# Patient Record
Sex: Male | Born: 1990 | Race: Asian | Hispanic: No | Marital: Single | Smoking: Former smoker
Health system: Southern US, Community
[De-identification: ages and names within clinical notes are randomized; demographics above are authoritative.]

## PROBLEM LIST (undated history)

## (undated) DIAGNOSIS — I2699 Other pulmonary embolism without acute cor pulmonale: Secondary | ICD-10-CM

## (undated) DIAGNOSIS — G43909 Migraine, unspecified, not intractable, without status migrainosus: Secondary | ICD-10-CM

---

## 2016-08-19 ENCOUNTER — Emergency Department (HOSPITAL_BASED_OUTPATIENT_CLINIC_OR_DEPARTMENT_OTHER)
Admission: EM | Admit: 2016-08-19 | Discharge: 2016-08-19 | Disposition: A | Discharge status: Home / Self Care | Payer: BLUE CROSS/BLUE SHIELD | Attending: Emergency Medicine | Admitting: Emergency Medicine

## 2016-08-19 ENCOUNTER — Emergency Department (HOSPITAL_BASED_OUTPATIENT_CLINIC_OR_DEPARTMENT_OTHER): Payer: BLUE CROSS/BLUE SHIELD

## 2016-08-19 ENCOUNTER — Encounter (HOSPITAL_BASED_OUTPATIENT_CLINIC_OR_DEPARTMENT_OTHER): Payer: Self-pay | Admitting: *Deleted

## 2016-08-19 DIAGNOSIS — Y929 Unspecified place or not applicable: Secondary | ICD-10-CM | POA: Insufficient documentation

## 2016-08-19 DIAGNOSIS — S0990XA Unspecified injury of head, initial encounter: Secondary | ICD-10-CM | POA: Diagnosis present

## 2016-08-19 DIAGNOSIS — Y999 Unspecified external cause status: Secondary | ICD-10-CM | POA: Insufficient documentation

## 2016-08-19 DIAGNOSIS — S060X0A Concussion without loss of consciousness, initial encounter: Secondary | ICD-10-CM | POA: Diagnosis not present

## 2016-08-19 DIAGNOSIS — Y9389 Activity, other specified: Secondary | ICD-10-CM | POA: Insufficient documentation

## 2016-08-19 MED ORDER — MECLIZINE HCL 25 MG PO TABS: 25.0000 mg | ORAL_TABLET | Freq: Three times a day (TID) | ORAL | 0 refills | Status: DC | PRN
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
## 2021-10-24 ENCOUNTER — Emergency Department (HOSPITAL_BASED_OUTPATIENT_CLINIC_OR_DEPARTMENT_OTHER): Payer: BC Managed Care – PPO

## 2021-10-24 ENCOUNTER — Emergency Department (HOSPITAL_BASED_OUTPATIENT_CLINIC_OR_DEPARTMENT_OTHER)
Admission: EM | Admit: 2021-10-24 | Discharge: 2021-10-24 | Disposition: A | Discharge status: Home / Self Care | Payer: BC Managed Care – PPO | Attending: Emergency Medicine | Admitting: Emergency Medicine

## 2021-10-24 ENCOUNTER — Encounter (HOSPITAL_BASED_OUTPATIENT_CLINIC_OR_DEPARTMENT_OTHER): Payer: Self-pay

## 2021-10-24 ENCOUNTER — Other Ambulatory Visit: Payer: Self-pay

## 2021-10-24 DIAGNOSIS — S59901A Unspecified injury of right elbow, initial encounter: Secondary | ICD-10-CM | POA: Diagnosis not present

## 2021-10-24 DIAGNOSIS — S29001A Unspecified injury of muscle and tendon of front wall of thorax, initial encounter: Secondary | ICD-10-CM | POA: Insufficient documentation

## 2021-10-24 DIAGNOSIS — S99912A Unspecified injury of left ankle, initial encounter: Secondary | ICD-10-CM | POA: Insufficient documentation

## 2021-10-24 DIAGNOSIS — S3991XA Unspecified injury of abdomen, initial encounter: Secondary | ICD-10-CM | POA: Insufficient documentation

## 2021-10-24 DIAGNOSIS — Y92410 Unspecified street and highway as the place of occurrence of the external cause: Secondary | ICD-10-CM | POA: Insufficient documentation

## 2021-10-24 LAB — COMPREHENSIVE METABOLIC PANEL
ALT: 57 U/L — ABNORMAL HIGH (ref 0–44)
AST: 37 U/L (ref 15–41)
Albumin: 4.6 g/dL (ref 3.5–5.0)
Alkaline Phosphatase: 55 U/L (ref 38–126)
Anion gap: 12 (ref 5–15)
BUN: 12 mg/dL (ref 6–20)
CO2: 23 mmol/L (ref 22–32)
Calcium: 8.8 mg/dL — ABNORMAL LOW (ref 8.9–10.3)
Chloride: 101 mmol/L (ref 98–111)
Creatinine, Ser: 0.97 mg/dL (ref 0.61–1.24)
GFR, Estimated: >60 mL/min (ref >60)
Glucose, Bld: 90 mg/dL (ref 70–99)
Potassium: 3.6 mmol/L (ref 3.5–5.1)
Sodium: 136 mmol/L (ref 135–145)
Total Bilirubin: 1.2 mg/dL (ref 0.3–1.2)
Total Protein: 7.5 g/dL (ref 6.5–8.1)

## 2021-10-24 LAB — CBC WITH DIFFERENTIAL/PLATELET
Abs Immature Granulocytes: 0.06 10*3/uL (ref 0.00–0.07)
Basophils Absolute: 0.1 10*3/uL (ref 0.0–0.1)
Basophils Relative: 0 %
Eosinophils Absolute: 0.1 10*3/uL (ref 0.0–0.5)
Eosinophils Relative: 1 %
HCT: 46.3 % (ref 39.0–52.0)
Hemoglobin: 15.8 g/dL (ref 13.0–17.0)
Immature Granulocytes: 1 %
Lymphocytes Relative: 19 %
Lymphs Abs: 2.2 10*3/uL (ref 0.7–4.0)
MCH: 29.3 pg (ref 26.0–34.0)
MCHC: 34.1 g/dL (ref 30.0–36.0)
MCV: 85.7 fL (ref 80.0–100.0)
Monocytes Absolute: 0.8 10*3/uL (ref 0.1–1.0)
Monocytes Relative: 7 %
Neutro Abs: 8.5 10*3/uL — ABNORMAL HIGH (ref 1.7–7.7)
Neutrophils Relative %: 72 %
Platelets: 223 10*3/uL (ref 150–400)
RBC: 5.4 MIL/uL (ref 4.22–5.81)
RDW: 12.3 % (ref 11.5–15.5)
WBC: 11.7 10*3/uL — ABNORMAL HIGH (ref 4.0–10.5)
nRBC: 0 % (ref 0.0–0.2)

## 2021-10-24 LAB — LIPASE, BLOOD: Lipase: 31 U/L (ref 11–51)

## 2021-10-24 MED ORDER — IOHEXOL 300 MG/ML  SOLN
100.0000 mL | Freq: Once | INTRAMUSCULAR | Status: AC | PRN
  Administered 2021-10-24: 100 mL via INTRAVENOUS

## 2021-10-24 MED ORDER — MORPHINE SULFATE (PF) 4 MG/ML IV SOLN
4.0000 mg | Freq: Once | INTRAVENOUS | Status: AC
  Administered 2021-10-24: 4 mg via INTRAVENOUS
  Filled 2021-10-24: qty 1

## 2021-10-24 MED ORDER — ONDANSETRON HCL 4 MG/2ML IJ SOLN
4.0000 mg | Freq: Once | INTRAMUSCULAR | Status: AC
  Administered 2021-10-24: 4 mg via INTRAVENOUS
  Filled 2021-10-24: qty 2
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
## 2021-10-24 NOTE — ED Notes (Signed)
Patient transported to CT 

## 2021-10-24 NOTE — Discharge Instructions (Addendum)
Call your primary care doctor or specialist as discussed in the next 2-3 days.   Return immediately back to the ER if:  Your symptoms worsen within the next 12-24 hours. You develop new symptoms such as new fevers, persistent vomiting, new pain, shortness of breath, or new weakness or numbness, or if you have any other concerns.  
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
## 2023-05-07 ENCOUNTER — Other Ambulatory Visit: Payer: Self-pay

## 2023-05-07 ENCOUNTER — Emergency Department (HOSPITAL_BASED_OUTPATIENT_CLINIC_OR_DEPARTMENT_OTHER): Payer: BC Managed Care – PPO

## 2023-05-07 ENCOUNTER — Emergency Department (HOSPITAL_BASED_OUTPATIENT_CLINIC_OR_DEPARTMENT_OTHER)
Admission: EM | Admit: 2023-05-07 | Discharge: 2023-05-07 | Discharge status: Against Medical Advice | Payer: BC Managed Care – PPO | Attending: Emergency Medicine | Admitting: Emergency Medicine

## 2023-05-07 ENCOUNTER — Encounter (HOSPITAL_BASED_OUTPATIENT_CLINIC_OR_DEPARTMENT_OTHER): Payer: Self-pay | Admitting: Emergency Medicine

## 2023-05-07 DIAGNOSIS — R519 Headache, unspecified: Secondary | ICD-10-CM | POA: Diagnosis present

## 2023-05-07 DIAGNOSIS — I7774 Dissection of vertebral artery: Secondary | ICD-10-CM | POA: Diagnosis present

## 2023-05-07 DIAGNOSIS — Z5329 Procedure and treatment not carried out because of patient's decision for other reasons: Secondary | ICD-10-CM | POA: Diagnosis not present

## 2023-05-07 HISTORY — DX: Other pulmonary embolism without acute cor pulmonale: I26.99

## 2023-05-07 HISTORY — DX: Migraine, unspecified, not intractable, without status migrainosus: G43.909

## 2023-05-07 LAB — CBC WITH DIFFERENTIAL/PLATELET
Abs Immature Granulocytes: 0.03 10*3/uL (ref 0.00–0.07)
Basophils Absolute: 0 10*3/uL (ref 0.0–0.1)
Basophils Relative: 1 %
Eosinophils Absolute: 0.1 10*3/uL (ref 0.0–0.5)
Eosinophils Relative: 2 %
HCT: 47.8 % (ref 39.0–52.0)
Hemoglobin: 16.1 g/dL (ref 13.0–17.0)
Immature Granulocytes: 0 %
Lymphocytes Relative: 28 %
Lymphs Abs: 2.1 10*3/uL (ref 0.7–4.0)
MCH: 28.6 pg (ref 26.0–34.0)
MCHC: 33.7 g/dL (ref 30.0–36.0)
MCV: 84.9 fL (ref 80.0–100.0)
Monocytes Absolute: 0.5 10*3/uL (ref 0.1–1.0)
Monocytes Relative: 7 %
Neutro Abs: 4.6 10*3/uL (ref 1.7–7.7)
Neutrophils Relative %: 62 %
Platelets: 325 10*3/uL (ref 150–400)
RBC: 5.63 MIL/uL (ref 4.22–5.81)
RDW: 11.7 % (ref 11.5–15.5)
WBC: 7.4 10*3/uL (ref 4.0–10.5)
nRBC: 0 % (ref 0.0–0.2)

## 2023-05-07 LAB — BASIC METABOLIC PANEL WITH GFR
Anion gap: 11 (ref 5–15)
BUN: 14 mg/dL (ref 6–20)
CO2: 26 mmol/L (ref 22–32)
Calcium: 9.4 mg/dL (ref 8.9–10.3)
Chloride: 101 mmol/L (ref 98–111)
Creatinine, Ser: 1.19 mg/dL (ref 0.61–1.24)
GFR, Estimated: >60 mL/min (ref >60)
Glucose, Bld: 96 mg/dL (ref 70–99)
Potassium: 3.3 mmol/L — ABNORMAL LOW (ref 3.5–5.1)
Sodium: 138 mmol/L (ref 135–145)

## 2023-05-07 MED ORDER — IOHEXOL 350 MG/ML SOLN
75.0000 mL | Freq: Once | INTRAVENOUS | Status: AC | PRN
  Administered 2023-05-07: 75 mL via INTRAVENOUS

## 2023-05-07 MED ORDER — PROCHLORPERAZINE EDISYLATE 10 MG/2ML IJ SOLN
10.0000 mg | Freq: Once | INTRAMUSCULAR | Status: AC
  Administered 2023-05-07: 10 mg via INTRAVENOUS
  Filled 2023-05-07: qty 2

## 2023-05-07 MED ORDER — DIPHENHYDRAMINE HCL 50 MG/ML IJ SOLN
12.5000 mg | Freq: Once | INTRAMUSCULAR | Status: AC
Start: 2023-05-07 — End: 2023-05-07
  Administered 2023-05-07: 12.5 mg via INTRAVENOUS
  Filled 2023-05-07: qty 1

## 2023-05-07 MED ORDER — APIXABAN 2.5 MG PO TABS: 5.0000 mg | ORAL_TABLET | Freq: Two times a day (BID) | ORAL | Status: DC

## 2023-05-07 MED ORDER — SODIUM CHLORIDE 0.9 % IV BOLUS
500.0000 mL | Freq: Once | INTRAVENOUS | Status: AC
  Administered 2023-05-07: 500 mL via INTRAVENOUS

## 2023-05-07 NOTE — Discharge Instructions (Signed)
You were seen in the emergency department for worsening headaches and neck pain.  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  Neurology is recommending admission so you can get an MRI of your brain along with neurology consult.  You elected to sign out AGAINST MEDICAL ADVICE.  I do recommend that you be seen at [facility] for further evaluation.

## 2023-05-07 NOTE — ED Notes (Signed)
Dr. Charm Barges has had conversations with the Pt. About need for an MRI of his brain.    The Pt. Has asked to go home and will sign out AMA.

## 2023-05-07 NOTE — ED Notes (Signed)
Patient denies pain and is resting comfortably.  

## 2023-05-07 NOTE — ED Notes (Signed)
Called [facility] to cancel bed placement , patient left AMA. @ 23:24

## 2023-05-07 NOTE — ED Notes (Signed)
This nurse was walking to the room. Patient was outside getting ready to leave. I informed the patient that he needed to sign the AMA form. Form was signed electronically. This nurse advised the patient that he needed to call 911 if his symptoms worsened. Patient advised that he understood. This nurse also informed him to go to [facility] tomorrow for an MRI early in the morning. Patient still decided to leave. Patient is ambulatory. No respiratory distress.

## 2023-05-07 NOTE — ED Notes (Signed)
Pt. Is feeling better with no headache

## 2023-05-07 NOTE — ED Notes (Signed)
Walked into room as the patient called out for assistance. Patient was inquiring about the results of his CT scan. Patient declined vital signs.

## 2023-05-07 NOTE — ED Notes (Signed)
Assumed care of patient.

## 2023-05-07 NOTE — ED Triage Notes (Signed)
Pt to ER states chronic migraines.  States and increase in frequency of headache with aura, last being yesterday.  Wanted to have things checked out since they have increases.

## 2023-05-07 NOTE — ED Notes (Signed)
Pt. Reports to RN he had a pain in the back of his neck a week or so ago.  Pt. Reports since the pain in the back of his neck he has had more frequent headaches.  Pt. In no distress at this time with no noted disabilities.  Pt. Has had no visual disturbance with only and Ora to his headache of black spots that show him he is getting a headache.

## 2023-05-07 NOTE — ED Notes (Signed)
Patient transported to CT 

## 2023-05-07 NOTE — ED Provider Notes (Signed)
[place] EMERGENCY DEPARTMENT AT [facility] Provider Note   CSN: [id number] Arrival date & time: 05/07/23  1503     History  Chief Complaint  Patient presents with   Migraine    [place] Parks is a 32 y.o. male.  He has a history of migraines and PE.  For the last week he has had increased frequency of headaches almost daily sometimes incapacitating.  Associated with the visual aura of wavy lines.  He typically was getting headaches maybe once or twice a month.  About a week ago he also said when he was getting out of the car he had an acute onset of pain in the back of his neck that felt electrical.  That pain has resolved but that is when the headache frequency started.  He has not seen a neurologist before.  He said when he takes ibuprofen it seems to help the headache but then it does not last long.  He is on Eliquis for PE.  The history is provided by the patient.  Migraine This is a recurrent problem. The problem occurs daily. The problem has not changed since onset.Pertinent negatives include no chest pain, no abdominal pain and no shortness of breath. Exacerbated by: light. Nothing relieves the symptoms. He has tried rest for the symptoms. The treatment provided mild relief.       Home Medications Prior to Admission medications   Medication Sig Start Date End Date Taking? Authorizing Provider  meclizine (ANTIVERT) 25 MG tablet Take 1 tablet (25 mg total) by mouth 3 (three) times daily as needed for dizziness. 08/19/16   Shaune Pollack, MD      Allergies    Patient has no known allergies.    Review of Systems   Review of Systems  Constitutional:  Negative for fever.  Eyes:  Positive for visual disturbance.  Respiratory:  Negative for shortness of breath.   Cardiovascular:  Negative for chest pain.  Gastrointestinal:  Negative for abdominal pain.  Neurological:  Negative for weakness and numbness.    Physical Exam Updated Vital Signs BP 128/82 (BP  Location: Left Arm)   Pulse 89   Temp 98.1 F (36.7 C)   Resp 18   Ht 5\' 9"  (1.753 m)   Wt 73.5 kg   SpO2 99%   BMI 23.92 kg/m  Physical Exam Vitals and nursing note reviewed.  Constitutional:      General: He is not in acute distress.    Appearance: Normal appearance. He is well-developed.  HENT:     Head: Normocephalic and atraumatic.  Eyes:     Conjunctiva/sclera: Conjunctivae normal.  Cardiovascular:     Rate and Rhythm: Normal rate and regular rhythm.     Heart sounds: No murmur heard. Pulmonary:     Effort: Pulmonary effort is normal. No respiratory distress.     Breath sounds: Normal breath sounds.  Abdominal:     Palpations: Abdomen is soft.     Tenderness: There is no abdominal tenderness.  Musculoskeletal:        General: No swelling.     Cervical back: Neck supple.  Skin:    General: Skin is warm and dry.     Capillary Refill: Capillary refill takes less than 2 seconds.  Neurological:     General: No focal deficit present.     Mental Status: He is alert and oriented to person, place, and time.     Cranial Nerves: No cranial nerve deficit.  Sensory: No sensory deficit.     Motor: No weakness.     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 Multiplanar CT image reconstructions and MIPs were  obtained to evaluate the vascular anatomy. Carotid stenosis measurements (when applicable) are obtained utilizing NASCET criteria, using the distal internal carotid diameter as the denominator. RADIATION DOSE REDUCTION: This exam was performed according to the departmental dose-optimization program which includes automated exposure control, adjustment of the mA and/or kV according to patient size and/or use of iterative reconstruction technique. CONTRAST:  75mL OMNIPAQUE IOHEXOL 350 MG/ML SOLN COMPARISON:  None Available. FINDINGS: CT HEAD FINDINGS Brain: No evidence of acute infarction, hemorrhage, hydrocephalus, extra-axial collection or mass lesion/mass effect. Vascular: See below. Skull: No acute fracture. Sinuses/Orbits: Clear sinuses.  No acute orbital findings. Other: No mastoid effusions. Review of the MIP images confirms the above findings CTA NECK FINDINGS Aortic arch: Great vessel origins are patent without significant stenosis. Right carotid system: No evidence of dissection, stenosis (50% or greater), or occlusion. Left carotid system: No evidence of dissection, stenosis (50% or greater), or occlusion. Vertebral arteries: Focal luminal regularity and eccentric filling defect within the distal right vertebral artery (series 11, image 183; series 13, image 82), which is highly suspicious for dissection with thrombus given the history and absence of atherosclerosis elsewhere. Moderate resulting stenosis. Otherwise, no significant stenosis. Right dominant. Skeleton: No acute fracture. Other neck: No acute abnormality on limited assessment. Upper chest: Visualized lung apices are clear. Review of the MIP images confirms the above findings CTA HEAD FINDINGS Anterior circulation: Bilateral intracranial ICAs, MCAs, and ACAs are patent without proximal hemodynamically significant stenosis. Posterior circulation: Bilateral intradural vertebral arteries, basilar artery and bilateral posterior cerebral arteries are  patent without proximal hemodynamically significant stenosis. Right fetal type PCA with small vertebrobasilar system, anatomic variant. Venous sinuses: As permitted by contrast timing, patent. 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 Electronically Signed   By: Feliberto Harts M.D.   On: 05/07/2023 21:38    Procedures Procedures    Medications Ordered in ED Medications  apixaban (ELIQUIS) tablet 5 mg (has no administration in time range)  prochlorperazine (COMPAZINE) injection 10 mg (10 mg Intravenous Given 05/07/23 1935)  diphenhydrAMINE (BENADRYL) injection 12.5 mg (12.5 mg Intravenous Given 05/07/23 1942)  sodium chloride 0.9 % bolus 500 mL (0 mLs Intravenous Stopped 05/07/23 2134)  iohexol (OMNIPAQUE) 350 MG/ML injection 75 mL (75 mLs Intravenous Contrast Given 05/07/23 2019)    ED Course/ Medical Decision Making/ A&P Clinical Course as of 05/07/23 2319  Wed May 07, 2023  2255 Discussed with neurologist Dr. Otelia Limes.  He felt the patient should be admitted to [facility] for MRI brain and formal neurology consultation.  I reviewed this with  patient and he is agreeable to plan. [MB]  2306 Discussed with Dr. Margo Aye [facility] hospitalist who will put the patient in for an admission at [facility].  I have also ordered his home Eliquis. [MB]  2318 Patient asked me to come talk to him more about his admission.  He says he will not be able to sleep here and he really just wants to go to his own bed.  I did say that it was safer for him to wait in the hospital for a bed to become available and get the MRI and neurology consult.  He understands this but he said he would rather sign out AGAINST MEDICAL ADVICE and try to go to [facility] tomorrow on his own to get the MRI and see neurology.  He understands that he could have worsening of his condition overnight including neurologic  disability or even death. [MB]    Clinical Course User Index [MB] Terrilee Files, MD                                 Medical Decision Making Amount and/or Complexity of Data Reviewed Labs: ordered. Radiology: ordered.  Risk Prescription drug management.   This patient complains of worsening migraines; this involves an extensive number of treatment Options and is a complaint that carries with it a high risk of complications and morbidity. The differential includes migraine headache, bleed, stroke, dissection  I ordered, reviewed and interpreted labs, which included CBC normal chemistries normal other than mildly low potassium I ordered medication Compazine and Benadryl fluids and reviewed PMP when indicated. I ordered imaging studies which included CT angio head and neck and I independently    visualized and interpreted imaging which showed possible right V2 dissection Previous records obtained and reviewed in epic, patient treated for PE back this January I consulted neurology Dr. Otelia Limes and [facility] hospitalist Dr. Margo Aye and discussed lab and imaging findings and discussed disposition.  Cardiac monitoring reviewed, signed this rhythm Social determinants considered, no significant barriers Critical Interventions: None  After the interventions stated above, I reevaluated the patient and found awake alert neuro intact with stable vitals Admission and further testing considered, I am recommending admission to the hospital and have talked to the hospitalist regarding same.  Ultimately patient is signing out AGAINST MEDICAL ADVICE.         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

## 2023-05-08 ENCOUNTER — Encounter (HOSPITAL_COMMUNITY): Payer: Self-pay

## 2023-05-08 ENCOUNTER — Emergency Department (HOSPITAL_COMMUNITY): Payer: BC Managed Care – PPO

## 2023-05-08 ENCOUNTER — Other Ambulatory Visit: Payer: Self-pay

## 2023-05-08 ENCOUNTER — Observation Stay (HOSPITAL_COMMUNITY)
Admission: EM | Admit: 2023-05-08 | Discharge: 2023-05-09 | Disposition: A | Discharge status: Home / Self Care | Payer: BC Managed Care – PPO | Attending: Internal Medicine | Admitting: Internal Medicine

## 2023-05-08 DIAGNOSIS — Z87891 Personal history of nicotine dependence: Secondary | ICD-10-CM | POA: Diagnosis not present

## 2023-05-08 DIAGNOSIS — Z8673 Personal history of transient ischemic attack (TIA), and cerebral infarction without residual deficits: Secondary | ICD-10-CM | POA: Diagnosis not present

## 2023-05-08 DIAGNOSIS — I639 Cerebral infarction, unspecified: Secondary | ICD-10-CM

## 2023-05-08 DIAGNOSIS — Z86711 Personal history of pulmonary embolism: Secondary | ICD-10-CM | POA: Diagnosis not present

## 2023-05-08 DIAGNOSIS — Z7901 Long term (current) use of anticoagulants: Secondary | ICD-10-CM | POA: Insufficient documentation

## 2023-05-08 DIAGNOSIS — G43509 Persistent migraine aura without cerebral infarction, not intractable, without status migrainosus: Secondary | ICD-10-CM | POA: Diagnosis not present

## 2023-05-08 DIAGNOSIS — I7774 Dissection of vertebral artery: Principal | ICD-10-CM | POA: Insufficient documentation

## 2023-05-08 DIAGNOSIS — Z79899 Other long term (current) drug therapy: Secondary | ICD-10-CM | POA: Diagnosis not present

## 2023-05-08 DIAGNOSIS — R519 Headache, unspecified: Secondary | ICD-10-CM | POA: Diagnosis present

## 2023-05-08 LAB — DIFFERENTIAL
Abs Immature Granulocytes: 0.02 10*3/uL (ref 0.00–0.07)
Basophils Absolute: 0 10*3/uL (ref 0.0–0.1)
Basophils Relative: 1 %
Eosinophils Absolute: 0.1 10*3/uL (ref 0.0–0.5)
Eosinophils Relative: 2 %
Immature Granulocytes: 0 %
Lymphocytes Relative: 30 %
Lymphs Abs: 2.1 10*3/uL (ref 0.7–4.0)
Monocytes Absolute: 0.4 10*3/uL (ref 0.1–1.0)
Monocytes Relative: 6 %
Neutro Abs: 4.5 10*3/uL (ref 1.7–7.7)
Neutrophils Relative %: 61 %

## 2023-05-08 LAB — CBC
HCT: 48.1 % (ref 39.0–52.0)
Hemoglobin: 16.2 g/dL (ref 13.0–17.0)
MCH: 29.1 pg (ref 26.0–34.0)
MCHC: 33.7 g/dL (ref 30.0–36.0)
MCV: 86.4 fL (ref 80.0–100.0)
Platelets: 320 10*3/uL (ref 150–400)
RBC: 5.57 MIL/uL (ref 4.22–5.81)
RDW: 11.7 % (ref 11.5–15.5)
WBC: 7.2 10*3/uL (ref 4.0–10.5)
nRBC: 0 % (ref 0.0–0.2)

## 2023-05-08 LAB — COMPREHENSIVE METABOLIC PANEL
ALT: 52 U/L — ABNORMAL HIGH (ref 0–44)
AST: 35 U/L (ref 15–41)
Albumin: 4.6 g/dL (ref 3.5–5.0)
Alkaline Phosphatase: 53 U/L (ref 38–126)
Anion gap: 11 (ref 5–15)
BUN: 12 mg/dL (ref 6–20)
CO2: 23 mmol/L (ref 22–32)
Calcium: 9.9 mg/dL (ref 8.9–10.3)
Chloride: 104 mmol/L (ref 98–111)
Creatinine, Ser: 1.17 mg/dL (ref 0.61–1.24)
GFR, Estimated: >60 mL/min (ref >60)
Glucose, Bld: 100 mg/dL — ABNORMAL HIGH (ref 70–99)
Potassium: 4.1 mmol/L (ref 3.5–5.1)
Sodium: 138 mmol/L (ref 135–145)
Total Bilirubin: 1 mg/dL (ref 0.3–1.2)
Total Protein: 7.5 g/dL (ref 6.5–8.1)

## 2023-05-08 LAB — PROTIME-INR
INR: 1.1 (ref 0.8–1.2)
Prothrombin Time: 14.3 seconds (ref 11.4–15.2)

## 2023-05-08 LAB — I-STAT CHEM 8, ED
BUN: 12 mg/dL (ref 6–20)
Calcium, Ion: 1.2 mmol/L (ref 1.15–1.40)
Chloride: 107 mmol/L (ref 98–111)
Creatinine, Ser: 1.2 mg/dL (ref 0.61–1.24)
Glucose, Bld: 105 mg/dL — ABNORMAL HIGH (ref 70–99)
HCT: 48 % (ref 39.0–52.0)
Hemoglobin: 16.3 g/dL (ref 13.0–17.0)
Potassium: 4.2 mmol/L (ref 3.5–5.1)
Sodium: 141 mmol/L (ref 135–145)
TCO2: 21 mmol/L — ABNORMAL LOW (ref 22–32)

## 2023-05-08 LAB — APTT: aPTT: 29 seconds (ref 24–36)

## 2023-05-08 LAB — ETHANOL: Alcohol, Ethyl (B): <10 mg/dL (ref <10)

## 2023-05-08 MED ORDER — APIXABAN 5 MG PO TABS
5.0000 mg | ORAL_TABLET | Freq: Two times a day (BID) | ORAL | Status: DC
  Administered 2023-05-08 – 2023-05-09 (×2): 5 mg via ORAL
  Filled 2023-05-08 (×2): qty 1

## 2023-05-08 MED ORDER — SODIUM CHLORIDE 0.9% FLUSH
3.0000 mL | Freq: Once | INTRAVENOUS | Status: AC
  Administered 2023-05-08: 3 mL via INTRAVENOUS
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
## 2023-05-08 NOTE — Assessment & Plan Note (Addendum)
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  - had follow-up with hematology at [facility] and unclear if it was provoked or unprovoked. Had hypercoagulable workup including protein S, C, Antithrombin deficiencies, factor V Leiden, prothrombin gene mutation, and APS. Unable to view documentation of hematology's follow up interpretation of his lab results. However pt was told that they were normal.  Antithrombin III and antithrombin antigen were elevated on the panel but these are difficult to interpret in the setting of anticoagulation use. -continue Eliquis and likely will need to maintain on this indefinitely-will need hematology consult as neurology is recommending possible transition to antiplatelet therapy for new vertebral artery dissection with thrombus

## 2023-05-08 NOTE — ED Provider Triage Note (Signed)
Emergency Medicine Provider Triage Evaluation Note  Lexie Swenson , a 32 y.o. male  was evaluated in triage.  Pt complains of headache has been ongoing for past couple of days, not consistent with his normal migraine.  Was found to have a vertebral artery dissection on CT last night, but left AMA.  Returns for his MRI  Review of Systems  Positive: As above Negative: As above  Physical Exam  BP 131/87 (BP Location: Right Arm)   Pulse 97   Temp 98.4 F (36.9 C) (Oral)   Resp 16   Ht 5\' 9"  (1.753 m)   Wt 73.5 kg   SpO2 98%   BMI 23.93 kg/m  Gen:   Awake, no distress   Resp:  Normal effort  MSK:   Moves extremities without difficulty   Currently stable Medical Decision Making  Medically screening exam initiated at 12:31 PM.  Appropriate orders placed.  Grantley Stan was informed that the remainder of the evaluation will be completed by another provider, this initial triage assessment does not replace that evaluation, and the importance of remaining in the ED until their evaluation is complete.  I called charge nurse and they are aware he needs to be brought back as soon as possible   Arabella Merles, [place] 05/08/23 1234

## 2023-05-08 NOTE — Assessment & Plan Note (Signed)
-  currently asymptomatic

## 2023-05-08 NOTE — ED Triage Notes (Addendum)
Pt c/o [id number]. Pt states was told to come here for an MRI, because the head CT showed a ruptured vein in head. Pt had head CT done at [facility]. 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

## 2023-05-08 NOTE — ED Provider Notes (Signed)
  Physical Exam  BP 127/83   Pulse 83   Temp 98.6 F (37 C) (Oral)   Resp 20   Ht 5\' 9"  (1.753 m)   Wt 73.5 kg   SpO2 99%   BMI 23.93 kg/m   Physical Exam Vitals and nursing note reviewed.  Constitutional:      General: He is not in acute distress.    Appearance: Normal appearance.  HENT:     Head: Normocephalic and atraumatic.     Mouth/Throat:     Mouth: Mucous membranes are moist.     Pharynx: Oropharynx is clear.  Eyes:     Extraocular Movements: Extraocular movements intact.     Pupils: Pupils are equal, round, and reactive to light.  Pulmonary:     Effort: Pulmonary effort is normal. No respiratory distress.  Neurological:     General: No focal deficit present.     Mental Status: He is alert and oriented to person, place, and time.     Procedures  Procedures  ED Course / MDM   Clinical Course as of 05/08/23 1741  Thu May 08, 2023  1459 Seen at [facility] last night for 1 month of headaches. 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  - MRI brain and neck - Does not need DAPT, on [facility] [[facility]]    Clinical Course User Index [[facility]] Claretha Cooper, DO   Medical Decision Making Amount and/or Complexity of Data Reviewed Labs: ordered.  Risk Decision regarding hospitalization.   At the time my assumption of care, patient is afebrile, hemodynamically stable, in no acute distress. Results reviewed.  MRI brain demonstrates punctate acute infarct in the right cerebellum.  MRA of the neck demonstrates signal abnormality in the region of the previously described dissection concerning for thrombus.  Results discussed at length with patient.  Neurology was previously consulted by outgoing team, reengaged and they recommend admission for further workup and medication optimization.  Discussed with hospitalist.  Patient admitted without further acute event under my care in the emergency department.       Claretha Cooper, DO 05/08/23 2301    Virgina Norfolk,  DO 05/08/23 2353

## 2023-05-08 NOTE — Assessment & Plan Note (Addendum)
-  Questionable punctate at right cerebral stroke.  Neurology Dr. Otelia Limes has reviewed imaging and felt less likely to be an artifact.   -Neurology has recommended continual workup with TTE

## 2023-05-08 NOTE — Consult Note (Signed)
NEURO HOSPITALIST CONSULT NOTE   Requesting physician: Dr. Lockie Mola  Reason for Consult: Right vertebral artery dissection  History obtained from:  Patient and Chart     HPI:                                                                                                                                          Mason Parks is a 32 y.o. male with a PMHx of small left PE on Eliquis and migraine with aura who initially presented to the ED yesterday for evaluation of right sided posterior neck pain and increased frequency of migraine over the past 2 weeks. Onset of neck pain was while turning his head when exiting a car. Since then, he has had waxing and waning neck pain that is at times severe. He has had no other neurological deficits other than increased frequency of scintillating scotoma typical of his usual migraines, but the frequency and severity has increased. CTA at [facility] yesterday revealed a right vertebral artery dissection. He was to be transferred to [facility] for stroke work up, but left AMA. He returned to the ED this afternoon to proceed with the recommended stroke work up. He is currently anticoagulated due to a pulmonary embolism diagnosed in January.      Past Medical History:  Diagnosis Date   Migraine    Pulmonary emboli (HCC)     History reviewed. No pertinent surgical history.  History reviewed. No pertinent family history.             Social History:  reports that he has quit smoking. His smoking use included cigarettes. He has never used smokeless tobacco. He reports that he does not currently use alcohol. He reports current drug use.  No Known Allergies  MEDICATIONS:                                                                                                                     No current facility-administered medications on file prior to encounter.   Current Outpatient Medications on File Prior to Encounter  Medication Sig Dispense Refill    ELIQUIS 5 MG TABS tablet Take 5 mg by mouth 2 (two) times daily.     MAGNESIUM PO Take 2 tablets by mouth daily.  Melatonin 10 MG TABS Take 1 tablet by mouth at bedtime as needed (sleep).     ROS:                                                                                                                                       Does not endorse any dizziness, double vision, vision loss, limb weakness, limb numbness, ataxia, current vertigo, or difficulty ambulating. Has posterior right neck pain and intermittent scintillating scotoma for the past several days as well as worsened migraine pain and migraine frequency. Other ROS as per HPI.    Blood pressure 127/73, pulse 76, temperature 98.2 F (36.8 C), temperature source Oral, resp. rate 18, height 5\' 9"  (1.753 m), weight 73.5 kg, SpO2 100%.   General Examination:                                                                                                       Physical Exam  HEENT-  [place]/AT    Lungs- Respirations unlabored Extremities- No edema  Neurological Examination Mental Status: Alert, oriented x 5, thought content appropriate.  Speech fluent without evidence of aphasia.  Able to follow all commands without difficulty. Cranial Nerves: II: Temporal visual fields intact with no extinction to DSS. PERRL  III,IV, VI: No ptosis. EOMI. No nystagmus.  V: Temp sensation equal bilaterally  VII: Smile symmetric VIII: Hearing intact to voice IX,X: No hoarseness XI: Symmetric shoulder shrug XII: Midline tongue extension Motor: BUE 5/5 proximally and distally BLE 5/5 proximally and distally  No pronator drift.  Sensory: Temp and light touch intact throughout, bilaterally. No extinction to DSS.  Deep Tendon Reflexes: 2+ and symmetric throughout Cerebellar: No ataxia with FNF and H-S bilaterally  Gait: Deferred   Lab Results: Basic Metabolic Panel: Recent Labs  Lab 05/07/23 1846 05/08/23 1217 05/08/23 1233  NA 138 138 141   K 3.3* 4.1 4.2  CL 101 104 107  CO2 26 23  --   GLUCOSE 96 100* 105*  BUN 14 12 12   CREATININE 1.19 1.17 1.20  CALCIUM 9.4 9.9  --     CBC: Recent Labs  Lab 05/07/23 1846 05/08/23 1217 05/08/23 1233  WBC 7.4 7.2  --   NEUTROABS 4.6 4.5  --   HGB 16.1 16.2 16.3  HCT 47.8 48.1 48.0  MCV 84.9 86.4  --   PLT 325 320  --     Cardiac Enzymes: No results for input(s): "CKTOTAL", "CKMB", "CKMBINDEX", "TROPONINI" in the  last 168 hours.  Lipid Panel: No results for input(s): "CHOL", "TRIG", "HDL", "CHOLHDL", "VLDL", "LDLCALC" in the last 168 hours.  Imaging: MR ANGIO NECK WO CONTRAST  Result Date: 05/08/2023 CLINICAL DATA:  Headache, increasing frequency or severity; Vertebral artery dissection suspected EXAM: MRI HEAD WITHOUT CONTRAST MRA NECK WITHOUT CONTRAST TECHNIQUE: Multiplanar, multi-echo pulse sequences of the brain and surrounding structures were acquired without intravenous contrast. Angiographic images of the neck were acquired using MRA technique without intravenous contrast. Carotid stenosis measurements (when applicable) are obtained utilizing NASCET criteria, using the distal internal carotid diameter as the denominator. 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 No hemorrhage. No hydrocephalus. No extra-axial fluid collection. No mass effect. No mass lesion. Vascular: Normal flow voids. Skull and upper cervical spine: Normal marrow signal. Sinuses/Orbits: No acute or significant finding. Other: None. MRA NECK FINDINGS Aortic arch: Two-vessel aortic arch.  No dissection. Right carotid system: Normal in caliber. No stenosis. No occlusion. Left carotid system: Normal in caliber.  No stenosis.  No occlusion. Vertebral arteries: There is T1 hyperintense signal abnormality in the region of dissection questioned on prior CTA head and neck angiogram (series 16, image 20). Findings  are worrisome for a focal dissection. Other: None. IMPRESSION: 1. 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 2. T1 hyperintense signal abnormality in the region of dissection questioned on prior CTA head and neck angiogram. 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 Electronically Signed   By: Lorenza Cambridge M.D.   On: 05/08/2023 17:36   MR BRAIN WO CONTRAST  Result Date: 05/08/2023 CLINICAL DATA:  Headache, increasing frequency or severity; Vertebral artery dissection suspected EXAM: MRI HEAD WITHOUT CONTRAST MRA NECK WITHOUT CONTRAST TECHNIQUE: Multiplanar, multi-echo pulse sequences of the brain and surrounding structures were acquired without intravenous contrast. Angiographic images of the neck were acquired using MRA technique without intravenous contrast. Carotid stenosis measurements (when applicable) are obtained utilizing NASCET criteria, using the distal internal carotid diameter as the denominator. 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 No hemorrhage. No hydrocephalus. No extra-axial fluid collection. No mass effect. No mass lesion. Vascular: Normal flow voids. Skull and upper cervical spine: Normal marrow signal. Sinuses/Orbits: No acute or significant finding. Other: None. MRA NECK FINDINGS Aortic arch: Two-vessel aortic arch.  No dissection. Right carotid system: Normal in caliber. No stenosis. No occlusion. Left carotid system: Normal in caliber.  No stenosis.  No occlusion. Vertebral arteries: There is T1 hyperintense signal abnormality in the region of dissection questioned on prior CTA head and neck angiogram (series 16, image 20). Findings are worrisome for a focal dissection. Other: None. IMPRESSION: 1. 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 2. T1 hyperintense signal abnormality in the region of dissection questioned on prior CTA head and neck  angiogram. 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 Electronically Signed   By: Lorenza Cambridge M.D.   On: 05/08/2023 17:36   CT ANGIO HEAD NECK W WO CM  Result Date: 05/07/2023 CLINICAL DATA:  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 Multiplanar CT image reconstructions and MIPs were obtained to evaluate the vascular anatomy. Carotid  stenosis measurements (when applicable) are obtained utilizing NASCET criteria, using the distal internal carotid diameter as the denominator. RADIATION DOSE REDUCTION: This exam was performed according to the departmental dose-optimization program which includes automated exposure control, adjustment of the mA and/or kV according to patient size and/or use of iterative reconstruction technique. CONTRAST:  75mL OMNIPAQUE IOHEXOL 350 MG/ML SOLN COMPARISON:  None Available. FINDINGS: CT HEAD FINDINGS Brain: No evidence of acute infarction, hemorrhage, hydrocephalus, extra-axial collection or mass lesion/mass effect. Vascular: See below. Skull: No acute fracture. Sinuses/Orbits: Clear sinuses.  No acute orbital findings. Other: No mastoid effusions. Review of the MIP images confirms the above findings CTA NECK FINDINGS Aortic arch: Great vessel origins are patent without significant stenosis. Right carotid system: No evidence of dissection, stenosis (50% or greater), or occlusion. Left carotid system: No evidence of dissection, stenosis (50% or greater), or occlusion. Vertebral arteries: Focal luminal regularity and eccentric filling defect within the distal right vertebral artery (series 11, image 183; series 13, image 82), which is highly suspicious for dissection with thrombus given the history and  absence of atherosclerosis elsewhere. Moderate resulting stenosis. Otherwise, no significant stenosis. Right dominant. Skeleton: No acute fracture. Other neck: No acute abnormality on limited assessment. Upper chest: Visualized lung apices are clear. Review of the MIP images confirms the above findings CTA HEAD FINDINGS Anterior circulation: Bilateral intracranial ICAs, MCAs, and ACAs are patent without proximal hemodynamically significant stenosis. Posterior circulation: Bilateral intradural vertebral arteries, basilar artery and bilateral posterior cerebral arteries are patent without proximal hemodynamically significant stenosis. Right fetal type PCA with small vertebrobasilar system, anatomic variant. Venous sinuses: As permitted by contrast timing, patent. 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 Electronically Signed   By: Feliberto Harts M.D.   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 Per history, the dissection most likely occurred 2 weeks ago when he experienced sudden onset of severe right posterior neck pain while exiting a car. No recent neck or head trauma. No chiropractic maneuvers endorsed by the patient.  - Exam reveals no deficits.  - CTA of head and neck performed yesterday: Findings concerning for dissection with thrombus in the distal right V2 vertebral artery, with resulting moderate stenosis. Specifically, there is focal luminal regularity and eccentric filling defect within the distal right vertebral artery (series 11, image 183; series 13, image 82), which is highly suspicious for dissection with thrombus given the history and absence of atherosclerosis elsewhere. - MRI brain obtained today: Official report documents a punctate acute infarct in the right cerebellum. However, on  personal review this is extremely faint and appears more consistent with artifact.  - MRA neck obtained today: T1 hyperintense signal abnormality in the region of dissection questioned on prior CTA head and neck angiogram. 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 - Overall impression: Acute right vertebral artery dissection - States that he has seen Hematology outpatient and that labs for hypercoagulability came back negative. May need to verify this via outside records.  - Irregular heart rate seen on Telemetry. May need Cardiology consult.    Recommendations: - Admit for assessments by Pulmonology and Cardiology, as well as cardiac telemetry.  - TTE.  - Pulmonology consult to assess for resolution of the previously diagnosed left PE. The patient states that due to insurance company denials, he has not  yet been able to obtain his 6 month chest CTA to assess for resolution.  - Will need to be continued on Eliquis for now. If PE no longer present on imaging, can be switched to ASA 325 mg po every day.  - Will need repeat CTA of head and neck in 6 months to assess for resolution of the vertebral artery dissection at that time.  - Advised not to lift weights or engage in any physical activity that would result in sudden neck movements or repetitive/pronounced lateral rotation, flexion or extension of the neck.  - Stroke Team to follow. - BP management per standard protocol. Out of the permissive HTN time window. The DWI signal seen in the right cerebellum was read as an acute stroke by Radiology, but on review by Neurology appears more likely to be artifactual.   Electronically signed: Dr. Caryl Pina  05/08/2023, 7:16 PM

## 2023-05-08 NOTE — Assessment & Plan Note (Addendum)
-  CTA head and neck demonstrated findings concerning for dissection with thrombus in the distal right V2 vertebral artery resulting in moderate stenosis. -Unclear cause of his dissection. Appears spontaneous. No current risk factors. Check UDS. -continue on Eliquis -will need follow up imaging in 6 months -neurology has recommended potentially re-imaging with CTA chest and to switch to aspirin if PE is resolved. However given it is unclear whether his PE was provoked or unprovoked will need to consult hematology in the morning for further recommendations.  -- Advised not to lift weights or engage in any physical activity that would result in sudden neck movements or repetitive/pronounced lateral rotation, flexion or extension of the neck.

## 2023-05-08 NOTE — H&P (Signed)
History and Physical    Patient: Mason Parks ION:[id number] DOB: 1990-10-19 DOA: 05/08/2023 DOS: the patient was seen and examined on 05/09/2023 PCP: Pcp, No  Patient coming from: Home  Chief Complaint:  Chief Complaint  Patient presents with   Migraine   HPI: Mason Parks is a 32 y.o. 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  Patient presented to [facility] ED yesterday with complaints of increased frequency of headaches with visual aura.  He normally gets about 1 migraine a month with visual aura.  However for the past week this has increased in frequency and intensity.  Migraines usually start with about an hour of visual aura and then once that resolves he will have migraine with photophobia and occasional nausea for several hours.  Has had migraines for 3 years.  He is not on any chronic abortive medication.  He reported acute onset of posterior neck pain with an electrical sensation when he turned his head while getting out of a car a week ago prior to the start of his headaches.  CTA head and neck demonstrated findings concerning for dissection with thrombus in the distal right V2 vertebral artery resulting in moderate stenosis.  Neurology was then consulted and admission was offered but patient left AMA to go back home to sleep and came back today for his recommended MRI.  MRI brain demonstrated punctate acute infarct in the right cerebellum.  MRA neck without contrast showed hyperintense signal abnormality in the region of dissection similar to one seen on prior CTA head and neck that are compatible with a focal dissection with thrombus.  Neurology Dr. Amada Jupiter was consulted and recommended admission for full stroke workup.   Recommended that he remains on Eliquis for vertebral dissection with thrombus. Hospitalist then consulted for admission.  Patient also has recent history of pulmonary embolism diagnosed in January 2024 and has been on apixaban.  He has followed up with [facility] hematology in February and it was unclear whether this was a provoked or unprovoked event.  He had full hypercoagulable workup and was interpreted as unrevealing per hematology.  He denies any current tobacco or illicit drug use.  Has used tobacco and marijuana in the past but has been several years ago.  Currently use nicotine patch.  Parents at bedside denies any family history of coagulopathy. Patient also denies any cough, shortness of breath, chest pain, nausea, vomiting or diarrhea.  No weight loss, fever or night sweats to suggest any occult malignancy. Only paternal grandmother had lung cancer but at the age of 87 and was a tobacco user. He is not sexually active.  Reports that this is the best he has ever felt since he has quit smoking and drugs.  Has been going to the gym regularly.   Review of Systems: As mentioned in the history of present illness. All other systems reviewed and are negative. Past Medical History:  Diagnosis Date   Migraine    Pulmonary emboli (HCC)    History reviewed. No pertinent surgical history. Social History:  reports that he has quit smoking. His smoking use included cigarettes. He has never used smokeless tobacco. He reports that he does not currently use alcohol. He reports current drug use.  No Known Allergies  History reviewed. No pertinent family history.  Prior to  Admission medications   Medication Sig Start Date End Date Taking? Authorizing Provider  ELIQUIS 5 MG TABS tablet Take 5 mg by mouth 2 (two) times daily.   Yes [provider]  MAGNESIUM PO Take 2 tablets by mouth daily.   Yes [provider]  Melatonin 10 MG TABS Take 1 tablet by mouth at bedtime as needed (sleep).    Yes [provider]    Physical Exam: Vitals:   05/09/23 0000 05/09/23 0015 05/09/23 0200 05/09/23 0240  BP: 118/73 108/80  127/71  Pulse: 78 87  64  Resp: 14 20  19   Temp:   98.2 F (36.8 C)   TempSrc:      SpO2: 98% 98%  97%  Weight:      Height:       Constitutional: NAD, calm, comfortable, young male sitting upright in bed Eyes: lids and conjunctivae normal ENMT: Mucous membranes are moist. Neck: normal, supple Respiratory: clear to auscultation bilaterally, no wheezing, no crackles. Normal respiratory effort. No accessory muscle use.  Cardiovascular: Regular rate and rhythm, no murmurs / rubs / gallops. No extremity edema. Abdomen: no tenderness, soft Musculoskeletal: no clubbing / cyanosis. No joint deformity upper and lower extremities. Good ROM, no contractures. Normal muscle tone.  Skin: no rashes, lesions, ulcers. No induration Neurologic: CN 2-12 grossly intact. Sensation intact,  Strength 5/5 in all 4.  Psychiatric: Normal judgment and insight. Alert and oriented x 3. Normal mood. Data Reviewed:  See HPI  Assessment and Plan: * Vertebral artery dissection (HCC) -CTA head and neck demonstrated findings concerning for dissection with thrombus in the distal right V2 vertebral artery resulting in moderate stenosis. -Unclear cause of his dissection. Appears spontaneous. No current risk factors. Check UDS. -continue on Eliquis -will need follow up imaging in 6 months -neurology has recommended potentially re-imaging with CTA chest and to switch to aspirin if PE is resolved. However given it is unclear whether his PE was provoked or unprovoked will need to consult hematology in the morning for further recommendations.  -- Advised not to lift weights or engage in any physical activity that would result in sudden neck movements or repetitive/pronounced lateral rotation, flexion or extension of the neck.   CVA (cerebral vascular accident) (HCC) -Questionable  punctate at right cerebral stroke.  Neurology Dr. Otelia Limes has reviewed imaging and felt less likely to be an artifact.   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  - had follow-up with hematology at [facility] and unclear if it was provoked or unprovoked. Had hypercoagulable workup including protein S, C, Antithrombin deficiencies, factor V Leiden, prothrombin gene mutation, and APS. Unable to view documentation of hematology's follow up interpretation of his lab results. However pt was told that they were normal.  Antithrombin III and antithrombin antigen were elevated on the panel but these are difficult to interpret in the setting of anticoagulation use. -continue Eliquis and likely will need to maintain on this indefinitely-will need hematology consult as neurology is recommending possible transition to antiplatelet therapy for new vertebral artery dissection with thrombus      Advance Care Planning: Full  Consults: neurology  Family Communication: Discussed with parents at bedside  Severity of Illness: The appropriate patient status for this patient is OBSERVATION.  Observation status is judged to be reasonable and necessary in order to provide the required intensity of service to ensure the patient's safety. The patient's presenting symptoms, physical exam findings, and initial radiographic and laboratory data in the context of their medical condition is felt to place them at decreased risk for further clinical deterioration. Furthermore, it is anticipated that the patient will be medically stable for discharge from the hospital within 2 midnights of admission.   Author: Anselm Jungling, DO 05/09/2023 3:13 AM  For on call  review [email or web address].

## 2023-05-09 ENCOUNTER — Observation Stay (HOSPITAL_BASED_OUTPATIENT_CLINIC_OR_DEPARTMENT_OTHER): Payer: BC Managed Care – PPO

## 2023-05-09 DIAGNOSIS — Z86711 Personal history of pulmonary embolism: Secondary | ICD-10-CM | POA: Diagnosis not present

## 2023-05-09 DIAGNOSIS — I6389 Other cerebral infarction: Secondary | ICD-10-CM | POA: Diagnosis not present

## 2023-05-09 DIAGNOSIS — I7774 Dissection of vertebral artery: Secondary | ICD-10-CM | POA: Diagnosis not present

## 2023-05-09 DIAGNOSIS — G43509 Persistent migraine aura without cerebral infarction, not intractable, without status migrainosus: Secondary | ICD-10-CM | POA: Diagnosis not present

## 2023-05-09 DIAGNOSIS — I639 Cerebral infarction, unspecified: Secondary | ICD-10-CM | POA: Diagnosis not present

## 2023-05-09 LAB — RAPID URINE DRUG SCREEN, HOSP PERFORMED
Amphetamines: NOT DETECTED
Barbiturates: NOT DETECTED
Benzodiazepines: NOT DETECTED
Cocaine: NOT DETECTED
Opiates: NOT DETECTED
Tetrahydrocannabinol: POSITIVE — AB

## 2023-05-09 LAB — LIPID PANEL
Cholesterol: 169 mg/dL (ref 0–200)
HDL: 33 mg/dL — ABNORMAL LOW (ref >40)
LDL Cholesterol: 97 mg/dL (ref 0–99)
Total CHOL/HDL Ratio: 5.1 RATIO
Triglycerides: 195 mg/dL — ABNORMAL HIGH (ref <150)
VLDL: 39 mg/dL (ref 0–40)

## 2023-05-09 LAB — CBC
HCT: 46.5 % (ref 39.0–52.0)
Hemoglobin: 15.1 g/dL (ref 13.0–17.0)
MCH: 28.2 pg (ref 26.0–34.0)
MCHC: 32.5 g/dL (ref 30.0–36.0)
MCV: 86.9 fL (ref 80.0–100.0)
Platelets: 299 10*3/uL (ref 150–400)
RBC: 5.35 MIL/uL (ref 4.22–5.81)
RDW: 11.7 % (ref 11.5–15.5)
WBC: 7.4 10*3/uL (ref 4.0–10.5)
nRBC: 0 % (ref 0.0–0.2)

## 2023-05-09 LAB — ECHOCARDIOGRAM COMPLETE
Area-P 1/2: 4.39 cm2
Calc EF: 60.9 %
Height: 69 in
S' Lateral: 2.3 cm
Single Plane A2C EF: 57.3 %
Single Plane A4C EF: 64.1 %
Weight: 2592.61 oz

## 2023-05-09 LAB — BASIC METABOLIC PANEL
Anion gap: 9 (ref 5–15)
BUN: 11 mg/dL (ref 6–20)
CO2: 23 mmol/L (ref 22–32)
Calcium: 9.4 mg/dL (ref 8.9–10.3)
Chloride: 106 mmol/L (ref 98–111)
Creatinine, Ser: 1.11 mg/dL (ref 0.61–1.24)
GFR, Estimated: >60 mL/min (ref >60)
Glucose, Bld: 96 mg/dL (ref 70–99)
Potassium: 3.7 mmol/L (ref 3.5–5.1)
Sodium: 138 mmol/L (ref 135–145)

## 2023-05-09 LAB — HEMOGLOBIN A1C
Hgb A1c MFr Bld: 5.3 % (ref 4.8–5.6)
Mean Plasma Glucose: 105.41 mg/dL

## 2023-05-09 LAB — HIV ANTIBODY (ROUTINE TESTING W REFLEX): HIV Screen 4th Generation wRfx: NONREACTIVE

## 2023-05-09 MED ORDER — ROSUVASTATIN CALCIUM 5 MG PO TABS: 10.0000 mg | ORAL_TABLET | Freq: Every day | ORAL | Status: DC

## 2023-05-09 MED ORDER — ROSUVASTATIN CALCIUM 10 MG PO TABS: 10.0000 mg | ORAL_TABLET | Freq: Every day | ORAL | 2 refills | Status: DC

## 2023-05-09 MED ORDER — TOPIRAMATE 50 MG PO TABS: 50.0000 mg | ORAL_TABLET | Freq: Two times a day (BID) | ORAL | 2 refills | Status: DC

## 2023-05-09 MED ORDER — TOPIRAMATE 25 MG PO TABS: 50.0000 mg | ORAL_TABLET | Freq: Two times a day (BID) | ORAL | Status: DC

## 2023-05-09 NOTE — Discharge Summary (Signed)
Physician Discharge Summary   Patient: Mason Parks MRN: [id number] DOB: [birth year]  Admit date:     05/08/2023  Discharge date: 05/09/23  Discharge Physician: Kathlen Mody   PCP: Pcp, No   Recommendations at discharge:  Please follow up with PCP in one week.  Please follow up with neurology as scheduled  Please follow up with Hematologist in 1 to 2 weeks.   Discharge Diagnoses: Principal Problem:   Vertebral artery dissection (HCC) Active Problems:   History of pulmonary embolism   Migraine aura, persistent   CVA (cerebral vascular accident) [facility])    Hospital Course:  Mason Parks is a 32 y.o. 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   Patient presented to [facility] ED yesterday with complaints of increased frequency of headaches with visual aura.  He normally gets about 1 migraine a month with visual aura.  However for the past week this has increased in frequency and intensity.  Migraines usually start with about an hour of visual aura and then once that resolves he will have migraine with photophobia and occasional nausea for several hours.  Has had migraines for 3 years.  He is not on any chronic abortive medication.  He reported acute onset of posterior neck pain with an electrical sensation when he turned his head while getting out of a car a week ago prior to the start of his headaches.   CTA head and neck demonstrated findings concerning for dissection with thrombus in the distal right V2 vertebral artery resulting in moderate stenosis.  Neurology was then consulted and admission was offered but patient left AMA to go back home to sleep and came back today for his recommended MRI.   MRI brain demonstrated punctate acute  infarct in the right cerebellum.  MRA neck without contrast showed hyperintense signal abnormality in the region of dissection similar to one seen on prior CTA head and neck that are compatible with a focal dissection with thrombus.   Neurology Dr. Amada Jupiter was consulted and recommended admission for full stroke workup.   Assessment and Plan: * Vertebral artery dissection (HCC) -CTA head and neck demonstrated findings concerning for dissection with thrombus in the distal right V2 vertebral artery resulting in moderate stenosis. -Unclear cause of his dissection. Appears spontaneous. No current risk factors.  UDS is positive for THC , as he uses THC gum.  -continue with  Eliquis -will need follow up imaging in 2 months. - patient currently is asymptomatic.  - recommended monitoring overnight , to check for arrhthymias, but patient is adamant about going home, and he already left AMA earlier, hence will discharge him on crestor, eliquis and Topamax.   -- Advised not to lift weights or engage in any physical activity that would result in sudden neck movements or repetitive/pronounced lateral rotation, flexion or extension of the neck.   CVA (cerebral vascular accident) (HCC) -Questionable punctate at right cerebral stroke.  Neurology Dr. Otelia Limes has reviewed imaging and felt less likely to be an artifact.   -Neurology has recommended continual workup with TTE. TTE Is unremarkable.  LDL elevated , crestor added on discharge.   Migraine aura, persistent -currently asymptomatic  - topamax added by neurology on discharge.   History of pulmonary embolism -Hx of  left lower lobe segmental pulmonary embolism with  small posterior left lower lobe pulmonary infarct (08/31/2022) with hx of long drive 2 weeks prior to diagnosis.  - had follow-up with hematology at [facility] and unclear if it was provoked or unprovoked. Had hypercoagulable workup including protein S, C, Antithrombin deficiencies,  factor V Leiden, prothrombin gene mutation, and APS. Unable to view documentation of hematology's follow up interpretation of his lab results. However pt was told that they were normal.  Antithrombin III and antithrombin antigen were elevated on the panel but these are difficult to interpret in the setting of anticoagulation use. -continue Eliquis and likely will need to maintain on this indefinitely- - will need hematology consult as neurology is recommending possible transition to antiplatelet therapy for new vertebral artery dissection with thrombus after the eliquis is discontinued.          Consultants: neurology Procedures performed: TTE MRI brain  Disposition: Home Diet recommendation:  Discharge Diet Orders (From admission, onward)     Start     Ordered   05/09/23 0000  Diet - low sodium heart healthy        05/09/23 1727           Regular diet DISCHARGE MEDICATION: Allergies as of 05/09/2023       Reactions   Pork-derived Products Other (See Comments)   Religious beliefs        Medication List     TAKE these medications    Eliquis 5 MG Tabs tablet Generic drug: apixaban Take 5 mg by mouth 2 (two) times daily.   MAGNESIUM PO Take 2 tablets by mouth daily.   Melatonin 10 MG Tabs Take 1 tablet by mouth at bedtime as needed (sleep).   rosuvastatin 10 MG tablet Commonly known as: CRESTOR Take 1 tablet (10 mg total) by mouth daily.   topiramate 50 MG tablet Commonly known as: TOPAMAX Take 1 tablet (50 mg total) by mouth 2 (two) times daily.        Follow-up Information     [facility] .   Contact information: [address]     [address] [place] [address] [phone]        Burney Gauze, MD Follow up.   Specialties: Hematology and Oncology, Internal Medicine Contact information: [address] [place] [address] [phone]                Discharge Exam: Ceasar Mons  Weights   05/08/23 1212  Weight: 73.5 kg   General exam: Appears calm and comfortable  Respiratory system: Clear to auscultation. Respiratory effort normal. Cardiovascular system: S1 & S2 heard, RRR. No JVD,  Gastrointestinal system: Abdomen is nondistended, soft and nontender.  Central nervous system: Alert and oriented. No focal neurological deficits. Extremities: Symmetric 5 x 5 power. Skin: No rashes,  Psychiatry:  Mood & affect appropriate.    Condition at discharge: fair  The results of significant diagnostics from this hospitalization (including imaging, microbiology, ancillary and laboratory) are listed below for reference.   Imaging Studies: ECHOCARDIOGRAM COMPLETE  Result Date: 05/09/2023    ECHOCARDIOGRAM REPORT   Patient Name:   Mason Parks Date of Exam: 05/09/2023 Medical Rec #:  [id number]   Height:       69.0 in Accession #:    [id number]  Weight:       162.0 lb Date of Birth:  Oct 14, 1990  BSA:          1.889 m Patient Age:    31 years    BP:  130/85 mmHg Patient Gender: M           HR:           87 bpm. Exam Location:  Inpatient Procedure: 2D Echo, 3D Echo, Cardiac Doppler and Color Doppler Indications:    Stroke  History:        Patient has no prior history of Echocardiogram examinations.                 Pulmonary embolus. Vertebral artery dissection.  Sonographer:    Sheralyn Boatman RDCS Referring Phys: [id number] CHING T TU IMPRESSIONS  1. Left ventricular ejection fraction, by estimation, is 60 to 65%. The left ventricle has normal function. The left ventricle has no regional wall motion abnormalities. Left ventricular diastolic parameters were normal.  2. Right ventricular systolic function is normal. The right ventricular size is normal.  3. The mitral valve is normal in structure. No evidence of mitral valve regurgitation. No evidence of mitral stenosis.  4. The aortic valve is normal in structure. Aortic valve regurgitation is not visualized. No aortic stenosis is present.   5. There appears to be an abnormality in the descending aorta.  6. The inferior vena cava is normal in size with greater than 50% respiratory variability, suggesting right atrial pressure of 3 mmHg. FINDINGS  Left Ventricle: Left ventricular ejection fraction, by estimation, is 60 to 65%. The left ventricle has normal function. The left ventricle has no regional wall motion abnormalities. The left ventricular internal cavity size was normal in size. There is  no left ventricular hypertrophy. Left ventricular diastolic parameters were normal. Right Ventricle: The right ventricular size is normal. No increase in right ventricular wall thickness. Right ventricular systolic function is normal. Left Atrium: Left atrial size was normal in size. Right Atrium: Right atrial size was normal in size. Pericardium: There is no evidence of pericardial effusion. Mitral Valve: The mitral valve is normal in structure. No evidence of mitral valve regurgitation. No evidence of mitral valve stenosis. Tricuspid Valve: The tricuspid valve is normal in structure. Tricuspid valve regurgitation is not demonstrated. No evidence of tricuspid stenosis. Aortic Valve: The aortic valve is normal in structure. Aortic valve regurgitation is not visualized. No aortic stenosis is present. Pulmonic Valve: The pulmonic valve was normal in structure. Pulmonic valve regurgitation is not visualized. No evidence of pulmonic stenosis. Aorta: There appears to be an abnormality in the descending aorta. The aortic root is normal in size and structure. Venous: The inferior vena cava is normal in size with greater than 50% respiratory variability, suggesting right atrial pressure of 3 mmHg. IAS/Shunts: No atrial level shunt detected by color flow Doppler.  LEFT VENTRICLE PLAX 2D LVIDd:         4.30 cm     Diastology LVIDs:         2.30 cm     LV e' medial:    10.20 cm/s LV PW:         0.90 cm     LV E/e' medial:  6.7 LV IVS:        0.80 cm     LV e' lateral:    27.00 cm/s LVOT diam:     2.00 cm     LV E/e' lateral: 2.5 LV SV:         56 LV SV Index:   29 LVOT Area:     3.14 cm  LV Volumes (MOD) LV vol d, MOD A2C: 72.1 ml LV vol d, MOD A4C:  71.4 ml LV vol s, MOD A2C: 30.8 ml LV vol s, MOD A4C: 25.6 ml LV SV MOD A2C:     41.3 ml LV SV MOD A4C:     71.4 ml LV SV MOD BP:      44.4 ml RIGHT VENTRICLE             IVC RV S prime:     14.40 cm/s  IVC diam: 1.10 cm TAPSE (M-mode): 1.8 cm LEFT ATRIUM             Index        RIGHT ATRIUM          Index LA diam:        2.80 cm 1.48 cm/m   RA Area:     8.46 cm LA Vol (A2C):   24.2 ml 12.81 ml/m  RA Volume:   14.70 ml 7.78 ml/m LA Vol (A4C):   10.9 ml 5.77 ml/m LA Biplane Vol: 17.7 ml 9.37 ml/m  AORTIC VALVE LVOT Vmax:   111.00 cm/s LVOT Vmean:  71.300 cm/s LVOT VTI:    0.177 m  AORTA Ao Root diam: 3.00 cm Ao Asc diam:  2.55 cm MITRAL VALVE MV Area (PHT): 4.39 cm    SHUNTS MV Decel Time: 173 msec    Systemic VTI:  0.18 m MV E velocity: 68.60 cm/s  Systemic Diam: 2.00 cm MV A velocity: 60.40 cm/s MV E/A ratio:  1.14 Aditya Sabharwal Electronically signed by Dorthula Nettles Signature Date/Time: 05/09/2023/4:05:41 PM    Final    MR ANGIO NECK WO CONTRAST  Result Date: 05/08/2023 CLINICAL DATA:  Headache, increasing frequency or severity; Vertebral artery dissection suspected EXAM: MRI HEAD WITHOUT CONTRAST MRA NECK WITHOUT CONTRAST TECHNIQUE: Multiplanar, multi-echo pulse sequences of the brain and surrounding structures were acquired without intravenous contrast. Angiographic images of the neck were acquired using MRA technique without intravenous contrast. Carotid stenosis measurements (when applicable) are obtained utilizing NASCET criteria, using the distal internal carotid diameter as the denominator. 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 No hemorrhage. No hydrocephalus. No extra-axial fluid collection. No mass  effect. No mass lesion. Vascular: Normal flow voids. Skull and upper cervical spine: Normal marrow signal. Sinuses/Orbits: No acute or significant finding. Other: None. MRA NECK FINDINGS Aortic arch: Two-vessel aortic arch.  No dissection. Right carotid system: Normal in caliber. No stenosis. No occlusion. Left carotid system: Normal in caliber.  No stenosis.  No occlusion. Vertebral arteries: There is T1 hyperintense signal abnormality in the region of dissection questioned on prior CTA head and neck angiogram (series 16, image 20). Findings are worrisome for a focal dissection. Other: None. IMPRESSION: 1. 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 2. T1 hyperintense signal abnormality in the region of dissection questioned on prior CTA head and neck angiogram. 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 Electronically Signed   By: Lorenza Cambridge M.D.   On: 05/08/2023 17:36   MR BRAIN WO CONTRAST  Result Date: 05/08/2023 CLINICAL DATA:  Headache, increasing frequency or severity; Vertebral artery dissection suspected EXAM: MRI HEAD WITHOUT CONTRAST MRA NECK WITHOUT CONTRAST TECHNIQUE: Multiplanar, multi-echo pulse sequences of the brain and surrounding structures were acquired without intravenous contrast. Angiographic images of the neck were acquired using MRA technique without intravenous contrast. Carotid stenosis measurements (when applicable) are obtained utilizing NASCET criteria, using the distal internal carotid diameter as the denominator. 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 No hemorrhage. No hydrocephalus. No extra-axial fluid collection. No mass effect. No mass lesion. Vascular: Normal flow voids. Skull and upper cervical spine: Normal marrow signal. Sinuses/Orbits: No acute or significant finding. Other: None. MRA NECK FINDINGS Aortic arch: Two-vessel aortic arch.   No dissection. Right carotid system: Normal in caliber. No stenosis. No occlusion. Left carotid system: Normal in caliber.  No stenosis.  No occlusion. Vertebral arteries: There is T1 hyperintense signal abnormality in the region of dissection questioned on prior CTA head and neck angiogram (series 16, image 20). Findings are worrisome for a focal dissection. Other: None. IMPRESSION: 1. 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 2. T1 hyperintense signal abnormality in the region of dissection questioned on prior CTA head and neck angiogram. 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 Electronically Signed   By: Lorenza Cambridge M.D.   On: 05/08/2023 17:36   CT ANGIO HEAD NECK W WO CM  Result Date: 05/07/2023 CLINICAL DATA:  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 Multiplanar CT image reconstructions and MIPs were obtained to evaluate the vascular anatomy. Carotid stenosis measurements (when applicable) are obtained utilizing NASCET criteria, using the distal internal carotid diameter as the denominator. RADIATION DOSE REDUCTION: This exam was performed according to the departmental dose-optimization program which includes automated exposure control, adjustment of the mA and/or kV according to patient size and/or use of iterative reconstruction technique. CONTRAST:  75mL OMNIPAQUE IOHEXOL 350 MG/ML SOLN COMPARISON:  None Available. FINDINGS: CT HEAD FINDINGS Brain: No evidence of acute infarction, hemorrhage, hydrocephalus, extra-axial collection or mass lesion/mass effect. Vascular: See below. Skull: No acute fracture. Sinuses/Orbits: Clear sinuses.  No acute orbital findings. Other: No mastoid effusions. Review of the MIP images  confirms the above findings CTA NECK FINDINGS Aortic arch: Great vessel origins are patent without significant stenosis. Right carotid system: No evidence of dissection, stenosis (50% or greater), or occlusion. Left carotid system: No evidence of dissection, stenosis (50% or greater), or occlusion. Vertebral arteries: Focal luminal regularity and eccentric filling defect within the distal right vertebral artery (series 11, image 183; series 13, image 82), which is highly suspicious for dissection with thrombus given the history and absence of atherosclerosis elsewhere. Moderate resulting stenosis. Otherwise, no significant stenosis. Right dominant. Skeleton: No acute fracture. Other neck: No acute abnormality on limited assessment. Upper chest: Visualized lung apices are clear. Review of the MIP images confirms the above findings CTA HEAD FINDINGS Anterior circulation: Bilateral intracranial ICAs, MCAs, and ACAs are patent without proximal hemodynamically significant stenosis. Posterior circulation: Bilateral intradural vertebral arteries, basilar artery and bilateral posterior cerebral arteries are patent without proximal hemodynamically significant stenosis. Right fetal type PCA with small vertebrobasilar system, anatomic variant. Venous sinuses: As permitted by contrast timing, patent. 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 Electronically Signed   By: Feliberto Harts M.D.   On: 05/07/2023 21:38    Microbiology: No results found for this or any previous visit.  Labs: CBC: Recent Labs  Lab 05/07/23 1846 05/08/23 1217 05/08/23 1233 05/09/23 0327  WBC 7.4 7.2  --  7.4  NEUTROABS 4.6 4.5  --   --   HGB 16.1 16.2 16.3 15.1  HCT 47.8 48.1 48.0 46.5  MCV 84.9 86.4  --  86.9  PLT 325 320  --  299   Basic Metabolic Panel: Recent Labs  Lab 05/07/23 1846  05/08/23 1217 05/08/23 1233 05/09/23 0327  NA 138 138 141 138  K 3.3* 4.1 4.2 3.7  CL 101 104 107 106  CO2 26 23  --  23  GLUCOSE 96 100* 105* 96  BUN 14 12 12 11   CREATININE 1.19 1.17 1.20 1.11  CALCIUM 9.4 9.9  --  9.4   Liver Function Tests: Recent Labs  Lab 05/08/23 1217  AST 35  ALT 52*  ALKPHOS 53  BILITOT 1.0  PROT 7.5  ALBUMIN 4.6   CBG: No results for input(s): "GLUCAP" in the last 168 hours.  Discharge time spent:  40 minutes.   Signed: Kathlen Mody, MD [facility] 05/09/2023

## 2023-05-09 NOTE — Progress Notes (Signed)
Pt arrived on unit. Pt acclimated to room call bell is within reach.

## 2023-05-09 NOTE — ED Notes (Signed)
ED TO INPATIENT HANDOFF REPORT  ED Nurse Name and Phone #: Beatris Ship RN [phone]  S Name/Age/Gender Mason Parks 32 y.o. male Room/Bed: 044C/044C  Code Status   Code Status: Full Code  Home/SNF/Other Home Patient oriented to: self, place, time, and situation Is this baseline? 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 Pt states was told to come here for an MRI, because the head CT showed a ruptured vein in head. Pt had head CT done at [facility]. 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   Allergies Allergies  Allergen Reactions   Pork-Derived Products Other (See Comments)    Religious beliefs    Level of Care/Admitting Diagnosis ED Disposition     ED Disposition  Admit   Condition  --   Comment  Hospital Area: [facility] [[id number]]  Level of Care: Progressive [102]  Admit to Progressive based on following criteria: NEUROLOGICAL AND NEUROSURGICAL complex patients with significant risk of instability, who do not meet ICU criteria, yet require close observation or frequent assessment (< / = every 2 - 4 hours) with medical / nursing intervention.  May place patient in observation at [facility] or [facility] if equivalent level of care is available:: No  Covid Evaluation: Asymptomatic - no recent exposure (last 10 days) testing not required  Diagnosis: Vertebral artery dissection [facility]) [[id number]]  Admitting Physician: Anselm Jungling [[id number]]  Attending Physician: Anselm Jungling [[id number]]          B Medical/Surgery History Past Medical History:  Diagnosis Date   Migraine    Pulmonary emboli (HCC)    History reviewed. No pertinent surgical history.   A IV Location/Drains/Wounds Patient Lines/Drains/Airways Status     Active Line/Drains/Airways     Name Placement date Placement time Site Days   Peripheral IV  05/08/23 20 G Anterior;Proximal;Right Forearm 05/08/23  1334  Forearm  1            Intake/Output Last 24 hours No intake or output data in the 24 hours ending 05/09/23 1205  Labs/Imaging Results for orders placed or performed during the hospital encounter of 05/08/23 (from the past 48 hour(s))  Protime-INR     Status: None   Collection Time: 05/08/23 12:17 PM  Result Value Ref Range   Prothrombin Time 14.3 11.4 - 15.2 seconds   INR 1.1 0.8 - 1.2    Comment: (NOTE) INR goal varies based on device and disease states. Performed at [facility], [address] [place], [place] [address]   APTT     Status: None   Collection Time: 05/08/23 12:17 PM  Result Value Ref Range   aPTT 29 24 - 36 seconds    Comment: Performed at [facility], [address] [place], [place] [address]  CBC     Status: None   Collection Time: 05/08/23 12:17 PM  Result Value Ref Range   WBC 7.2 4.0 - 10.5 K/uL   RBC 5.57 4.22 - 5.81 MIL/uL   Hemoglobin 16.2 13.0 - 17.0 g/dL   HCT 94.8 54.6 - 27.0 %   MCV 86.4 80.0 - 100.0 fL   MCH 29.1 26.0 - 34.0 pg   MCHC 33.7 30.0 - 36.0 g/dL   RDW 35.0 09.3 - 81.8 %   Platelets 320 150 - 400 K/uL   nRBC 0.0 0.0 - 0.2 %  Comment: Performed at [facility], [address], [place], [place] [address]  Differential     Status: None   Collection Time: 05/08/23 12:17 PM  Result Value Ref Range   Neutrophils Relative % 61 %   Neutro Abs 4.5 1.7 - 7.7 K/uL   Lymphocytes Relative 30 %   Lymphs Abs 2.1 0.7 - 4.0 K/uL   Monocytes Relative 6 %   Monocytes Absolute 0.4 0.1 - 1.0 K/uL   Eosinophils Relative 2 %   Eosinophils Absolute 0.1 0.0 - 0.5 K/uL   Basophils Relative 1 %   Basophils Absolute 0.0 0.0 - 0.1 K/uL   Immature Granulocytes 0 %   Abs Immature Granulocytes 0.02 0.00 - 0.07 K/uL    Comment: Performed at [facility], [address] [place], [place] [address]  Comprehensive metabolic panel     Status: Abnormal   Collection  Time: 05/08/23 12:17 PM  Result Value Ref Range   Sodium 138 135 - 145 mmol/L   Potassium 4.1 3.5 - 5.1 mmol/L   Chloride 104 98 - 111 mmol/L   CO2 23 22 - 32 mmol/L   Glucose, Bld 100 (H) 70 - 99 mg/dL    Comment: Glucose reference range applies only to samples taken after fasting for at least 8 hours.   BUN 12 6 - 20 mg/dL   Creatinine, Ser 4.01 0.61 - 1.24 mg/dL   Calcium 9.9 8.9 - 02.7 mg/dL   Total Protein 7.5 6.5 - 8.1 g/dL   Albumin 4.6 3.5 - 5.0 g/dL   AST 35 15 - 41 U/L   ALT 52 (H) 0 - 44 U/L   Alkaline Phosphatase 53 38 - 126 U/L   Total Bilirubin 1.0 0.3 - 1.2 mg/dL   GFR, Estimated >25 >36 mL/min    Comment: (NOTE) Calculated using the CKD-EPI Creatinine Equation (2021)    Anion gap 11 5 - 15    Comment: Performed at [facility], [address] [place], [place] [address]  Ethanol     Status: None   Collection Time: 05/08/23 12:17 PM  Result Value Ref Range   Alcohol, Ethyl (B) <10 <10 mg/dL    Comment: (NOTE) Lowest detectable limit for serum alcohol is 10 mg/dL.  For medical purposes only. Performed at [facility], [address] [place], [place] [address]   I-stat chem 8, ED     Status: Abnormal   Collection Time: 05/08/23 12:33 PM  Result Value Ref Range   Sodium 141 135 - 145 mmol/L   Potassium 4.2 3.5 - 5.1 mmol/L   Chloride 107 98 - 111 mmol/L   BUN 12 6 - 20 mg/dL   Creatinine, Ser 9.56 0.61 - 1.24 mg/dL   Glucose, Bld 387 (H) 70 - 99 mg/dL    Comment: Glucose reference range applies only to samples taken after fasting for at least 8 hours.   Calcium, Ion 1.20 1.15 - 1.40 mmol/L   TCO2 21 (L) 22 - 32 mmol/L   Hemoglobin 16.3 13.0 - 17.0 g/dL   HCT 56.4 33.2 - 95.1 %  HIV Antibody (routine testing w rflx)     Status: None   Collection Time: 05/09/23  3:27 AM  Result Value Ref Range   HIV Screen 4th Generation wRfx Non Reactive Non Reactive    Comment: Performed at [facility], [address], [place], [place] [address]   CBC     Status: None  Collection Time: 05/09/23  3:27 AM  Result Value Ref Range   WBC 7.4 4.0 - 10.5 K/uL   RBC 5.35 4.22 - 5.81 MIL/uL   Hemoglobin 15.1 13.0 - 17.0 g/dL   HCT 16.1 09.6 - 04.5 %   MCV 86.9 80.0 - 100.0 fL   MCH 28.2 26.0 - 34.0 pg   MCHC 32.5 30.0 - 36.0 g/dL   RDW 40.9 81.1 - 91.4 %   Platelets 299 150 - 400 K/uL   nRBC 0.0 0.0 - 0.2 %    Comment: Performed at [facility], [address], [place], [place] [address]  Basic metabolic panel     Status: None   Collection Time: 05/09/23  3:27 AM  Result Value Ref Range   Sodium 138 135 - 145 mmol/L   Potassium 3.7 3.5 - 5.1 mmol/L   Chloride 106 98 - 111 mmol/L   CO2 23 22 - 32 mmol/L   Glucose, Bld 96 70 - 99 mg/dL    Comment: Glucose reference range applies only to samples taken after fasting for at least 8 hours.   BUN 11 6 - 20 mg/dL   Creatinine, Ser 6.21 0.61 - 1.24 mg/dL   Calcium 9.4 8.9 - 30.8 mg/dL   GFR, Estimated >65 >78 mL/min    Comment: (NOTE) Calculated using the CKD-EPI Creatinine Equation (2021)    Anion gap 9 5 - 15    Comment: Performed at [facility], [address] [place], [place] [address]  Lipid panel     Status: Abnormal   Collection Time: 05/09/23  3:27 AM  Result Value Ref Range   Cholesterol 169 0 - 200 mg/dL   Triglycerides 952 (H) <150 mg/dL   HDL 33 (L) >84 mg/dL   Total CHOL/HDL Ratio 5.1 RATIO   VLDL 39 0 - 40 mg/dL   LDL Cholesterol 97 0 - 99 mg/dL    Comment:        Total Cholesterol/HDL:CHD Risk Coronary Heart Disease Risk Table                     Men   Women  1/2 Average Risk   3.4   3.3  Average Risk       5.0   4.4  2 X Average Risk   9.6   7.1  3 X Average Risk  23.4   11.0        Use the calculated Patient Ratio above and the CHD Risk Table to determine the patient's CHD Risk.        ATP III CLASSIFICATION (LDL):  <100     mg/dL   Optimal  132-440  mg/dL   Near or Above                    Optimal  130-159  mg/dL   Borderline  102-725   mg/dL   High  >366     mg/dL   Very High Performed at [facility], [address], [place], [place] [address]   Hemoglobin A1c     Status: None   Collection Time: 05/09/23  3:27 AM  Result Value Ref Range   Hgb A1c MFr Bld 5.3 4.8 - 5.6 %    Comment: (NOTE) Pre diabetes:          5.7%-6.4%  Diabetes:              >6.4%  Glycemic control for   <  7.0% adults with diabetes    Mean Plasma Glucose 105.41 mg/dL    Comment: Performed at [facility], [address] [place], [place] [address]  Rapid urine drug screen (hospital performed)     Status: Abnormal   Collection Time: 05/09/23 10:17 AM  Result Value Ref Range   Opiates NONE DETECTED NONE DETECTED   Cocaine NONE DETECTED NONE DETECTED   Benzodiazepines NONE DETECTED NONE DETECTED   Amphetamines NONE DETECTED NONE DETECTED   Tetrahydrocannabinol POSITIVE (A) NONE DETECTED   Barbiturates NONE DETECTED NONE DETECTED    Comment: (NOTE) DRUG SCREEN FOR MEDICAL PURPOSES ONLY.  IF CONFIRMATION IS NEEDED FOR ANY PURPOSE, NOTIFY LAB WITHIN 5 DAYS.  LOWEST DETECTABLE LIMITS FOR URINE DRUG SCREEN Drug Class                     Cutoff (ng/mL) Amphetamine and metabolites    1000 Barbiturate and metabolites    200 Benzodiazepine                 200 Opiates and metabolites        300 Cocaine and metabolites        300 THC                            50 Performed at [facility], [address], [place], [place] [address]    MR ANGIO NECK WO CONTRAST  Result Date: 05/08/2023 CLINICAL DATA:  Headache, increasing frequency or severity; Vertebral artery dissection suspected EXAM: MRI HEAD WITHOUT CONTRAST MRA NECK WITHOUT CONTRAST TECHNIQUE: Multiplanar, multi-echo pulse sequences of the brain and surrounding structures were acquired without intravenous contrast. Angiographic images of the neck were acquired using MRA technique without intravenous contrast. Carotid stenosis measurements (when applicable) are obtained  utilizing NASCET criteria, using the distal internal carotid diameter as the denominator. 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 No hemorrhage. No hydrocephalus. No extra-axial fluid collection. No mass effect. No mass lesion. Vascular: Normal flow voids. Skull and upper cervical spine: Normal marrow signal. Sinuses/Orbits: No acute or significant finding. Other: None. MRA NECK FINDINGS Aortic arch: Two-vessel aortic arch.  No dissection. Right carotid system: Normal in caliber. No stenosis. No occlusion. Left carotid system: Normal in caliber.  No stenosis.  No occlusion. Vertebral arteries: There is T1 hyperintense signal abnormality in the region of dissection questioned on prior CTA head and neck angiogram (series 16, image 20). Findings are worrisome for a focal dissection. Other: None. IMPRESSION: 1. 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 2. T1 hyperintense signal abnormality in the region of dissection questioned on prior CTA head and neck angiogram. 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 Electronically Signed   By: Lorenza Cambridge M.D.   On: 05/08/2023 17:36   MR BRAIN WO CONTRAST  Result Date: 05/08/2023 CLINICAL DATA:  Headache, increasing frequency or severity; Vertebral artery dissection suspected EXAM: MRI HEAD WITHOUT CONTRAST MRA NECK WITHOUT CONTRAST TECHNIQUE: Multiplanar, multi-echo pulse sequences of the brain and surrounding structures were acquired without intravenous contrast. Angiographic images of the neck were acquired using MRA technique without intravenous contrast. Carotid stenosis measurements (when applicable) are obtained utilizing NASCET criteria, using the distal internal carotid diameter as the denominator. 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 No hemorrhage. No hydrocephalus. No extra-axial fluid collection. No mass effect. No mass lesion. Vascular: Normal flow voids. Skull and upper cervical spine: Normal marrow signal. Sinuses/Orbits: No acute or significant finding. Other: None. MRA NECK FINDINGS Aortic arch: Two-vessel aortic arch.  No dissection. Right carotid system: Normal in caliber. No stenosis. No occlusion. Left carotid system: Normal in caliber.  No stenosis.  No occlusion. Vertebral arteries: There is T1 hyperintense signal abnormality in the region of dissection questioned on prior CTA head and neck angiogram (series 16, image 20). Findings are worrisome for a focal dissection. Other: None. IMPRESSION: 1. 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 2. T1 hyperintense signal abnormality in the region of dissection questioned on prior CTA head and neck angiogram. 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 Electronically Signed   By: Lorenza Cambridge M.D.   On: 05/08/2023 17:36   CT ANGIO HEAD NECK W WO CM  Result Date: 05/07/2023 CLINICAL DATA:  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 Multiplanar CT image reconstructions and MIPs were obtained to evaluate the vascular anatomy. Carotid stenosis measurements (when applicable) are obtained utilizing NASCET criteria, using the distal internal carotid diameter as the denominator. RADIATION DOSE REDUCTION: This exam was performed according to the departmental dose-optimization program which includes automated exposure control, adjustment of the mA and/or kV according to patient size and/or use of iterative reconstruction technique. CONTRAST:  75mL OMNIPAQUE IOHEXOL 350 MG/ML SOLN COMPARISON:  None  Available. FINDINGS: CT HEAD FINDINGS Brain: No evidence of acute infarction, hemorrhage, hydrocephalus, extra-axial collection or mass lesion/mass effect. Vascular: See below. Skull: No acute fracture. Sinuses/Orbits: Clear sinuses.  No acute orbital findings. Other: No mastoid effusions. Review of the MIP images confirms the above findings CTA NECK FINDINGS Aortic arch: Great vessel origins are patent without significant stenosis. Right carotid system: No evidence of dissection, stenosis (50% or greater), or occlusion. Left carotid system: No evidence of dissection, stenosis (50% or greater), or occlusion. Vertebral arteries: Focal luminal regularity and eccentric filling defect within the distal right vertebral artery (series 11, image 183; series 13, image 82), which is highly suspicious for dissection with thrombus given the history and absence of atherosclerosis elsewhere. Moderate resulting stenosis. Otherwise, no significant stenosis. Right dominant. Skeleton: No acute fracture. Other neck: No acute abnormality on limited assessment. Upper chest: Visualized lung apices are clear. Review of the MIP images confirms the above findings CTA HEAD FINDINGS Anterior circulation: Bilateral intracranial ICAs, MCAs, and ACAs are patent without proximal hemodynamically significant stenosis. Posterior circulation: Bilateral intradural vertebral arteries, basilar artery and bilateral posterior cerebral arteries are patent without proximal hemodynamically significant stenosis. Right fetal type PCA with small vertebrobasilar system, anatomic variant. Venous sinuses: As permitted by contrast timing, patent. 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 Electronically Signed   By: Feliberto Harts M.D.   On: 05/07/2023 21:38    Pending Labs Unresulted Labs (From admission, onward)     None       Vitals/Pain Today's Vitals   05/09/23 0800 05/09/23 0828 05/09/23 0830 05/09/23 0830  BP: 111/67 111/67    Pulse: (!) 59 80    Resp: 15  20    Temp:    97.7 F (36.5 C)  TempSrc:    Oral  SpO2: 98% 100%    Weight:      Height:      PainSc:   0-No pain     Isolation Precautions No active isolations  Medications Medications  apixaban (ELIQUIS) tablet 5 mg (5 mg Oral Given 05/09/23 1016)  sodium chloride flush (NS) 0.9 % injection 3 mL (3 mLs Intravenous Given 05/08/23 1339)    Mobility walks      R Recommendations: See Admitting Provider Note  Report given to: [id number]

## 2023-05-09 NOTE — Progress Notes (Signed)
Patient ready for discharge to home; discharge instructions given and reviewed; Rx's sent electronically; informed to f/u with PCP in 1 week for permission to return to work, no driving advised, and no lifting or strenuous activity per MD until cleared by PCP; f/u with neurology is in 2 months. Patient dressed and ready to discharge; discharged out via wheelchair accompanied by his parents.

## 2023-05-09 NOTE — Progress Notes (Incomplete)
[facility]                                                                               Mason Parks, is a 32 y.o. male, DOB - [birth year], ZOX:[id number] Admit date - 05/08/2023    Outpatient Primary MD for the patient is Pcp, No  LOS - 0  days    Brief summary      Assessment & Plan    Assessment and Plan: * Vertebral artery dissection (HCC) -CTA head and neck demonstrated findings concerning for dissection with thrombus in the distal right V2 vertebral artery resulting in moderate stenosis. -Unclear cause of his dissection. Appears spontaneous. No current risk factors. Check UDS. -continue on Eliquis -will need follow up imaging in 6 months -neurology has recommended potentially re-imaging with CTA chest and to switch to aspirin if PE is resolved. However given it is unclear whether his PE was provoked or unprovoked will need to consult hematology in the morning for further recommendations.  -- Advised not to lift weights or engage in any physical activity that would result in sudden neck movements or repetitive/pronounced lateral rotation, flexion or extension of the neck.   CVA (cerebral vascular accident) (HCC) -Questionable punctate at right cerebral stroke.  Neurology Dr. Otelia Limes has reviewed imaging and felt less likely to be an artifact.   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  - had follow-up with hematology at [facility] and unclear if it was provoked or unprovoked. Had hypercoagulable workup including protein S, C, Antithrombin deficiencies, factor V Leiden, prothrombin gene mutation, and APS. Unable to view documentation of hematology's follow up interpretation of his lab  results. However pt was told that they were normal.  Antithrombin III and antithrombin antigen were elevated on the panel but these are difficult to interpret in the setting of anticoagulation use. -continue Eliquis and likely will need to maintain on this indefinitely-will need hematology consult as neurology is recommending possible transition to antiplatelet therapy for new vertebral artery dissection with thrombus       Estimated body mass index is 23.93 kg/m as calculated from the following:   Height as of this encounter: 5\' 9"  (1.753 m).   Weight as of this encounter: 73.5 kg.  Code Status: *** DVT Prophylaxis:   apixaban (ELIQUIS) tablet 5 mg   Level of Care: Level of care: Progressive Family Communication: none at bedside.   Disposition Plan:     Remains inpatient appropriate:  pending neurology   Procedures:  MR angio of the brain and neck  Consultants:   neurology  Antimicrobials:   Anti-infectives (From admission, onward)    None        Medications  Scheduled Meds:  apixaban  5 mg Oral BID   Continuous Infusions: PRN Meds:.    Subjective:   Charels Parks was seen and examined today.  Reports feeling back to baseline.  Objective:   Vitals:   05/09/23  0600 05/09/23 0800 05/09/23 0828 05/09/23 0830  BP:  111/67 111/67   Pulse:  (!) 59 80   Resp:  15 20   Temp: 98 F (36.7 C)   97.7 F (36.5 C)  TempSrc:    Oral  SpO2:  98% 100%   Weight:      Height:       No intake or output data in the 24 hours ending 05/09/23 1155 Filed Weights   05/08/23 1212  Weight: 73.5 kg     Exam General exam: Appears calm and comfortable  Respiratory system: Clear to auscultation. Respiratory effort normal. Cardiovascular system: S1 & S2 heard, RRR. No JVD,  Gastrointestinal system: Abdomen is nondistended, soft and nontender.  Central nervous system: Alert and oriented. No focal neurological deficits. Extremities: Symmetric 5 x 5 power. Skin: No rashes,  lesions or ulcers Psychiatry: Mood & affect appropriate.    Data Reviewed:  I have personally reviewed following labs and imaging studies   CBC Lab Results  Component Value Date   WBC 7.4 05/09/2023   RBC 5.35 05/09/2023   HGB 15.1 05/09/2023   HCT 46.5 05/09/2023   MCV 86.9 05/09/2023   MCH 28.2 05/09/2023   PLT 299 05/09/2023   MCHC 32.5 05/09/2023   RDW 11.7 05/09/2023   LYMPHSABS 2.1 05/08/2023   MONOABS 0.4 05/08/2023   EOSABS 0.1 05/08/2023   BASOSABS 0.0 05/08/2023     Last metabolic panel Lab Results  Component Value Date   NA 138 05/09/2023   K 3.7 05/09/2023   CL 106 05/09/2023   CO2 23 05/09/2023   BUN 11 05/09/2023   CREATININE 1.11 05/09/2023   GLUCOSE 96 05/09/2023   GFRNONAA >60 05/09/2023   CALCIUM 9.4 05/09/2023   PROT 7.5 05/08/2023   ALBUMIN 4.6 05/08/2023   BILITOT 1.0 05/08/2023   ALKPHOS 53 05/08/2023   AST 35 05/08/2023   ALT 52 (H) 05/08/2023   ANIONGAP 9 05/09/2023    CBG (last 3)  No results for input(s): "GLUCAP" in the last 72 hours.    Coagulation Profile: Recent Labs  Lab 05/08/23 1217  INR 1.1     Radiology Studies: MR ANGIO NECK WO CONTRAST  Result Date: 05/08/2023 CLINICAL DATA:  Headache, increasing frequency or severity; Vertebral artery dissection suspected EXAM: MRI HEAD WITHOUT CONTRAST MRA NECK WITHOUT CONTRAST TECHNIQUE: Multiplanar, multi-echo pulse sequences of the brain and surrounding structures were acquired without intravenous contrast. Angiographic images of the neck were acquired using MRA technique without intravenous contrast. Carotid stenosis measurements (when applicable) are obtained utilizing NASCET criteria, using the distal internal carotid diameter as the denominator. 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 No hemorrhage. No hydrocephalus. No extra-axial fluid collection. No mass effect. No  mass lesion. Vascular: Normal flow voids. Skull and upper cervical spine: Normal marrow signal. Sinuses/Orbits: No acute or significant finding. Other: None. MRA NECK FINDINGS Aortic arch: Two-vessel aortic arch.  No dissection. Right carotid system: Normal in caliber. No stenosis. No occlusion. Left carotid system: Normal in caliber.  No stenosis.  No occlusion. Vertebral arteries: There is T1 hyperintense signal abnormality in the region of dissection questioned on prior CTA head and neck angiogram (series 16, image 20). Findings are worrisome for a focal dissection. Other: None. IMPRESSION: 1. 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 2. T1 hyperintense signal abnormality in the region of dissection questioned on prior CTA head and  neck angiogram. 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 Electronically Signed   By: Lorenza Cambridge M.D.   On: 05/08/2023 17:36   MR BRAIN WO CONTRAST  Result Date: 05/08/2023 CLINICAL DATA:  Headache, increasing frequency or severity; Vertebral artery dissection suspected EXAM: MRI HEAD WITHOUT CONTRAST MRA NECK WITHOUT CONTRAST TECHNIQUE: Multiplanar, multi-echo pulse sequences of the brain and surrounding structures were acquired without intravenous contrast. Angiographic images of the neck were acquired using MRA technique without intravenous contrast. Carotid stenosis measurements (when applicable) are obtained utilizing NASCET criteria, using the distal internal carotid diameter as the denominator. 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 No hemorrhage. No hydrocephalus. No extra-axial fluid collection. No mass effect. No mass lesion. Vascular: Normal flow voids. Skull and upper cervical spine: Normal marrow signal. Sinuses/Orbits: No acute or significant finding. Other: None. MRA NECK FINDINGS Aortic arch: Two-vessel aortic arch.  No  dissection. Right carotid system: Normal in caliber. No stenosis. No occlusion. Left carotid system: Normal in caliber.  No stenosis.  No occlusion. Vertebral arteries: There is T1 hyperintense signal abnormality in the region of dissection questioned on prior CTA head and neck angiogram (series 16, image 20). Findings are worrisome for a focal dissection. Other: None. IMPRESSION: 1. 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 2. T1 hyperintense signal abnormality in the region of dissection questioned on prior CTA head and neck angiogram. 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 Electronically Signed   By: Lorenza Cambridge M.D.   On: 05/08/2023 17:36   CT ANGIO HEAD NECK W WO CM  Result Date: 05/07/2023 CLINICAL DATA:  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 Multiplanar CT image reconstructions and MIPs were obtained to evaluate the vascular anatomy. Carotid stenosis measurements (when applicable) are obtained utilizing NASCET criteria, using the distal internal carotid diameter as the denominator. RADIATION DOSE REDUCTION: This exam was performed according to the departmental dose-optimization program which includes automated exposure control, adjustment of the mA and/or kV according to patient size and/or use of iterative reconstruction technique. CONTRAST:  75mL OMNIPAQUE IOHEXOL 350 MG/ML SOLN COMPARISON:  None Available. FINDINGS: CT HEAD FINDINGS Brain: No evidence of acute infarction, hemorrhage, hydrocephalus, extra-axial collection or mass lesion/mass effect. Vascular: See below. Skull: No acute fracture. Sinuses/Orbits: Clear sinuses.  No acute orbital findings. Other: No mastoid effusions. Review of the MIP images  confirms the above findings CTA NECK FINDINGS Aortic arch: Great vessel origins are patent without significant stenosis. Right carotid system: No evidence of dissection, stenosis (50% or greater), or occlusion. Left carotid system: No evidence of dissection, stenosis (50% or greater), or occlusion. Vertebral arteries: Focal luminal regularity and eccentric filling defect within the distal right vertebral artery (series 11, image 183; series 13, image 82), which is highly suspicious for dissection with thrombus given the history and absence of atherosclerosis elsewhere. Moderate resulting stenosis. Otherwise, no significant stenosis. Right dominant. Skeleton: No acute fracture. Other neck: No acute abnormality on limited assessment. Upper chest: Visualized lung apices are clear. Review of the MIP images confirms the above findings CTA HEAD FINDINGS Anterior circulation: Bilateral intracranial ICAs, MCAs, and ACAs are patent without proximal hemodynamically significant stenosis. Posterior circulation: Bilateral intradural vertebral arteries,  basilar artery and bilateral posterior cerebral arteries are patent without proximal hemodynamically significant stenosis. Right fetal type PCA with small vertebrobasilar system, anatomic variant. Venous sinuses: As permitted by contrast timing, patent. 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 Electronically Signed   By: Feliberto Harts M.D.   On: 05/07/2023 21:38       Kathlen Mody M.D. [facility] 05/09/2023, 11:55 AM  Available via Epic secure chat 7am-7pm After 7 pm, please refer to night coverage provider listed on amion.

## 2023-05-09 NOTE — Progress Notes (Addendum)
STROKE TEAM PROGRESS NOTE   BRIEF HPI Mr. Mason Parks is a 32 y.o. male with history of small left PE on Eliquis and migraine with aura who initially presented to the ED 9/19 for evaluation of right sided posterior neck pain and increased frequency of migraine over the past 2 weeks. CTA completed at [facility] shows a right vertebral artery dissection and he was transferred to [facility] for stroke work up.   INTERIM HISTORY/SUBJECTIVE History of migraines 1-2x per month but has had more this last 2 weeks- auras flashing zig zag Missed a few doses of eliquis since he started it but not more than one a month  2 weeks ago he was getting out of his car when he noticed a sharp pain up the back of his neck into his head on the right side.  He then started having daily headaches and that is what prompted him to come to the ED.  OBJECTIVE  CBC    Component Value Date/Time   WBC 7.4 05/09/2023 0327   RBC 5.35 05/09/2023 0327   HGB 15.1 05/09/2023 0327   HCT 46.5 05/09/2023 0327   PLT 299 05/09/2023 0327   MCV 86.9 05/09/2023 0327   MCH 28.2 05/09/2023 0327   MCHC 32.5 05/09/2023 0327   RDW 11.7 05/09/2023 0327   LYMPHSABS 2.1 05/08/2023 1217   MONOABS 0.4 05/08/2023 1217   EOSABS 0.1 05/08/2023 1217   BASOSABS 0.0 05/08/2023 1217    BMET    Component Value Date/Time   NA 138 05/09/2023 0327   K 3.7 05/09/2023 0327   CL 106 05/09/2023 0327   CO2 23 05/09/2023 0327   GLUCOSE 96 05/09/2023 0327   BUN 11 05/09/2023 0327   CREATININE 1.11 05/09/2023 0327   CALCIUM 9.4 05/09/2023 0327   GFRNONAA >60 05/09/2023 0327    IMAGING past 24 hours MR ANGIO NECK WO CONTRAST  Result Date: 05/08/2023 CLINICAL DATA:  Headache, increasing frequency or severity; Vertebral artery dissection suspected EXAM: MRI HEAD WITHOUT CONTRAST MRA NECK WITHOUT CONTRAST TECHNIQUE: Multiplanar, multi-echo pulse sequences of the brain and surrounding structures were acquired without intravenous contrast. Angiographic images  of the neck were acquired using MRA technique without intravenous contrast. Carotid stenosis measurements (when applicable) are obtained utilizing NASCET criteria, using the distal internal carotid diameter as the denominator. 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 No hemorrhage. No hydrocephalus. No extra-axial fluid collection. No mass effect. No mass lesion. Vascular: Normal flow voids. Skull and upper cervical spine: Normal marrow signal. Sinuses/Orbits: No acute or significant finding. Other: None. MRA NECK FINDINGS Aortic arch: Two-vessel aortic arch.  No dissection. Right carotid system: Normal in caliber. No stenosis. No occlusion. Left carotid system: Normal in caliber.  No stenosis.  No occlusion. Vertebral arteries: There is T1 hyperintense signal abnormality in the region of dissection questioned on prior CTA head and neck angiogram (series 16, image 20). Findings are worrisome for a focal dissection. Other: None. IMPRESSION: 1. 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 2. T1 hyperintense signal abnormality in the region of dissection questioned on prior CTA head and neck angiogram. 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 Electronically Signed   By: Lorenza Cambridge M.D.   On: 05/08/2023 17:36   MR BRAIN WO CONTRAST  Result Date: 05/08/2023 CLINICAL DATA:  Headache, increasing frequency or severity; Vertebral artery dissection suspected EXAM: MRI HEAD WITHOUT CONTRAST MRA NECK  WITHOUT CONTRAST TECHNIQUE: Multiplanar, multi-echo pulse sequences of the brain and surrounding structures were acquired without intravenous contrast. Angiographic images of the neck were acquired using MRA technique without intravenous contrast. Carotid stenosis measurements (when applicable) are obtained utilizing NASCET criteria, using the distal internal carotid diameter as the  denominator. 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 No hemorrhage. No hydrocephalus. No extra-axial fluid collection. No mass effect. No mass lesion. Vascular: Normal flow voids. Skull and upper cervical spine: Normal marrow signal. Sinuses/Orbits: No acute or significant finding. Other: None. MRA NECK FINDINGS Aortic arch: Two-vessel aortic arch.  No dissection. Right carotid system: Normal in caliber. No stenosis. No occlusion. Left carotid system: Normal in caliber.  No stenosis.  No occlusion. Vertebral arteries: There is T1 hyperintense signal abnormality in the region of dissection questioned on prior CTA head and neck angiogram (series 16, image 20). Findings are worrisome for a focal dissection. Other: None. IMPRESSION: 1. 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 2. T1 hyperintense signal abnormality in the region of dissection questioned on prior CTA head and neck angiogram. 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 Electronically Signed   By: Lorenza Cambridge M.D.   On: 05/08/2023 17:36    Vitals:   05/09/23 0200 05/09/23 0240 05/09/23 0400 05/09/23 0600  BP:  127/71 121/75   Pulse:  64 87   Resp:  19 10   Temp: 98.2 F (36.8 C)   98 F (36.7 C)  TempSrc:      SpO2:  97% 98%   Weight:      Height:         PHYSICAL EXAM General:  Alert, well-nourished, well-developed patient in no acute distress Psych:  Mood and affect appropriate for situation CV: Regular rate and rhythm on monitor Respiratory:  Regular, unlabored respirations on room air GI: Abdomen soft and nontender   NEURO:  Mental Status: AA&Ox3, patient is able to give clear and coherent history Speech/Language: speech is without dysarthria or aphasia.  Naming, repetition, fluency, and comprehension intact.  Cranial Nerves:  II: PERRL. Visual fields full.  III, IV, VI: EOMI. Eyelids  elevate symmetrically.  V: Sensation is intact to light touch and symmetrical to face.  VII: Face is symmetrical resting and smiling VIII: hearing intact to voice. IX, X: Palate elevates symmetrically. Phonation is normal.  RU:[id number] shrug 5/5. XII: tongue is midline without fasciculations. Motor: 5/5 strength to all muscle groups tested.  Tone: is normal and bulk is normal Sensation- Intact to light touch bilaterally. Extinction absent to light touch to DSS.   Coordination: FTN intact bilaterally, HKS: no ataxia in BLE.No drift.  Gait- deferred   ASSESSMENT/PLAN  Acute Ischemic Infarct:  Punctate infarct in the right cerebellum, etiology:  R V2 dissection with thrombus CTA head & neck dissection with thrombus in the distal right V2 vertebral artery MRI Punctate acute infarct in the right cerebellum.  MRA Neck T1 hyperintense signal abnormality in the region of dissection questioned on prior CTA head and neck angiogram. 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 2D Echo 60-65% LDL 97 HgbA1c 5.3 VTE prophylaxis - Eliquis Eliquis (apixaban) daily prior to admission, now on Eliquis (apixaban) daily. Continue eliquis on discharge. Recommend to repeat CTA neck in 2 months to evaluate healing of R [facility] Therapy recommendations:  No follow up needed Disposition:  Home  Hx of pulmonary embolism Currently on  Eliquis  Started in January on 2024 Previous hypercoagulable work up done in 09/2022- negative and followed up with hematology.  Pt stated that he has appointment with hematology in 08/2023. Presumably will take off eliquis at that time. We recommend ASA 81 after eliquis discontinuation   Migraine with aura Increase frequency of migraine aura after neck pain for the last 2 weeks Patient takes ibuprofen at home for migraine Not seen neurology in the past Put on Topamax 50 twice daily Refer to Dr. Lucia Gaskins for follow-up at [facility]  Hyperlipidemia LDL 97, goal less than  70 Put on Crestor 10 No high intensity statin given LDL near goal Continue statin on discharge  THC abuse Patient using THC gum Cessation education provided Patient is willing to quit  Other Stroke Risk Factors   Hospital day # 0   Patient seen and examined by NP/APP with MD. MD to update note as needed.   Mason Picker, DNP, FNP-BC [facility] Pager: [phone]  ATTENDING NOTE: I reviewed above note and agree with the assessment and plan. Pt was seen and examined.   Parents at bedside.  Patient sitting in bed, neuro intact, stated that he had neck pain 2 weeks ago after getting out of car with head maneuver, did not notice any abnormal maneuver but did have sharp right neck pain behind the ear.  Neck pain gradually getting better over time but increased migraine aura which triggered him come to ER for evaluation.  Found to have right V2 dissection with thrombus.  Continue Eliquis for now.  Recommend to repeat CTA neck in 2 months to evaluate vessel healing process.  Of note, he did have history of PE earlier this year, on Eliquis for now, follow-up with hematology but workup negative.  Plan to have Eliquis continue until next January.  Increased migraine aura, put on Topamax 50 mg twice daily.  Will refer to Dr. Lucia Gaskins at [facility] for further management.  Continue Eliquis, add Crestor 10 for lipid management.  PT and OT No recommendation.  He has appoint with hematology next January.  For detailed assessment and plan, please refer to above/below as I have made changes wherever appropriate.   Neurology will sign off. Please call with questions. Pt will follow up with Dr. Lucia Gaskins at [facility] in about 2-3 weeks. Thanks for the consult.   Marvel Plan, MD PhD Stroke Neurology 05/09/2023 6:15 PM    To contact Stroke Continuity provider, please refer to [email or web address]. After hours, contact General Neurology

## 2023-05-09 NOTE — Progress Notes (Signed)
  Echocardiogram 2D Echocardiogram has been performed.  Janalyn Harder 05/09/2023, 4:01 PM

## 2023-05-19 ENCOUNTER — Telehealth: Payer: Self-pay | Admitting: Neurology

## 2023-05-19 NOTE — Telephone Encounter (Addendum)
FYI: [facility] Sandra Cockayne, NP wanted to give Dr. Frances Furbish the heads up. Pt is needing to be cleared to go back work. Ethelene Browns Younger said would glad to send any paperwork that is needed. Do not want a call back.

## 2023-05-19 NOTE — Telephone Encounter (Signed)
It looks like, Dr. Lucia Gaskins was requested, per Dr. Roda Shutters.

## 2023-05-20 ENCOUNTER — Encounter: Payer: Self-pay | Admitting: Neurology

## 2023-05-20 ENCOUNTER — Ambulatory Visit (INDEPENDENT_AMBULATORY_CARE_PROVIDER_SITE_OTHER): Payer: BC Managed Care – PPO | Admitting: Neurology

## 2023-05-20 VITALS — BP 128/78 | HR 95 | Ht 69.0 in | Wt 154.0 lb

## 2023-05-20 DIAGNOSIS — I7774 Dissection of vertebral artery: Secondary | ICD-10-CM

## 2023-05-20 DIAGNOSIS — I639 Cerebral infarction, unspecified: Secondary | ICD-10-CM | POA: Diagnosis not present

## 2023-05-20 DIAGNOSIS — G43109 Migraine with aura, not intractable, without status migrainosus: Secondary | ICD-10-CM

## 2023-05-20 NOTE — Progress Notes (Signed)
Subjective:    Patient ID: Mason Parks is a 32 y.o. male.  HPI    Mason Foley, MD, PhD [facility] [address], [address] [address] [place], [place] [address]  I saw patient, Mason Parks, as a referral from the hospital for management of his migraine headaches.  The patient is unaccompanied today.  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  He feels essentially back to baseline, no significant weakness, no significant headaches, has some minor neck discomfort at times but nothing that warrants medication.  Denies any one-sided weakness, has had some tingling in his left hand but this was not part of his presentation when he went to the hospital.  He has a several year history of intermittent migraines, they are not very frequent but are preceded by visual aura typically which lasts for 30 to 45 minutes on average.  He has a migraine headache about once or twice a month, he takes ibuprofen or Tylenol as needed.  He has never had any sudden onset of one-sided weakness or numbness or tingling or droopy face or slurring of speech.he quit smoking some 2 years ago, quit drinking alcohol about 2 years ago as well.  He limits his caffeine to 1 serving of coffee or tea on average.  He tries to hydrate well with water.  He reports that he was given a 1 week work excuse when he left the hospital and was advised to follow-up with his PCP to get clearance for work.  He reports that he generally tolerates the Topamax but in the beginning may have had some dizziness from it.  His dizziness has improved in the past week.  He had some episodic dizziness a week ago but was also coming down with a cold and feels that it was related to that.  He is still on Eliquis, he is still taking Topamax.  He has  met with hematology recently.  The encounter note is not visible for me in epic.  He reports that they may take him off of Eliquis.  He is wondering if he needs to continue with Topamax.   In addition, he has questions about the location of his vertebral artery dissection, I answered his questions to the best of my abilities, he had questions about the differences between arteries and veins, which I answered.  I also was able to show him a schematic picture of vertebral arteries joining to the basilar artery.  He presented to the emergency room at [facility] on 05/07/2023 with nearly daily headaches.  I reviewed the emergency room records.  He was treated symptomatically with prochlorperazine, diphenhydramine and IV fluids.  He was found to have imaging findings concerning for vertebral artery dissection.  He was advised to be admitted to [facility] but decided to go home AGAINST MEDICAL ADVICE and come back to be admitted.    I reviewed hospital records.  He was admitted on 05/08/2023 and discharged on 05/09/2023 with history of vertebral artery dissection, cerebellar stroke, pulmonary embolism and migraine headache.  He had a brain MRI without contrast as well as neck MR angiogram without contrast on 05/08/2023 and I reviewed the results:    IMPRESSION: 1. 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 2. T1 hyperintense signal abnormality in the region of dissection questioned on prior CTA  head and neck angiogram. 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  His UDS was positive for THC, he reported using a THC gummy.  He was advised to continue with Eliquis and get follow-up imaging in 2 months.  He did not have any therapy needs.  He was advised to continue to be monitored overnight but he was adamant about going home.  He was discharged on Crestor, and Eliquis.  Topamax was added as per neurology recommendation at 50 mg twice daily.   I reviewed the stroke team  consultation note from 05/09/2023.  Echocardiogram showed EF of 60 to 65%, LDL was 97, A1c 5.3.  He was advised to continue with Eliquis and get a repeat CT angiogram of the neck in 2 months to demonstrate healing of the right vertebral artery dissection.  He reported having a follow-up with hematology in January 2025. Mason Parks with the stroke team recommended baby aspirin if Eliquis was discontinued in the future.  He was advised to start Crestor 10 mg strength with a LDL goal of less than 70.  He was counseled on THC cessation.       He had a CT angiogram of the head and neck with and without contrast through the emergency department at [facility] on 05/07/2023 and I reviewed the results:  IMPRESSION: Findings concerning for dissection with thrombus in the distal right V2 vertebral artery. Resulting moderate stenosis.     His Past Medical History Is Significant For: Past Medical History:  Diagnosis Date   Migraine    Pulmonary emboli (HCC)     His Past Surgical History Is Significant For: History reviewed. No pertinent surgical history.  His Family History Is Significant For: Family History  Problem Relation Age of Onset   Hypertension Mother    Migraines Neg Hx     His Social History Is Significant For: Social History   Socioeconomic History   Marital status: Single    Spouse name: Not on file   Number of children: 0   Years of education: Not on file   Highest education level: Not on file  Occupational History   Not on file  Tobacco Use   Smoking status: Former    Types: Cigarettes   Smokeless tobacco: Current   Tobacco comments:    Nicotine pouches   Vaping Use   Vaping status: Former  Substance and Sexual Activity   Alcohol use: Not Currently   Drug use: Not Currently    Comment: Delta 9 gummies ("not anymore")   Sexual activity: Not on file  Other Topics Concern   Not on file  Social History Narrative   Caffeine: 1 cup/day   Right handed   He  is engaged    Social Determinants of Corporate investment banker Strain: Not on file  Food Insecurity: No Food Insecurity (05/09/2023)   Hunger Vital Sign    Worried About Running Out of Food in the Last Year: Never true    Ran Out of Food in the Last Year: Never true  Transportation Needs: No Transportation Needs (05/09/2023)   PRAPARE - Administrator, Civil Service (Medical): No    Lack of Transportation (Non-Medical): No  Physical Activity: Not on file  Stress: Not on file  Social Connections: Unknown (08/31/2022)   Received from [facility]   Social Network    Social Network: Not on file    His Allergies Are:  Allergies  Allergen Reactions  Pork-Derived Products Other (See Comments)    Religious beliefs  :   His Current Medications Are:  Outpatient Encounter Medications as of 05/20/2023  Medication Sig   ELIQUIS 5 MG TABS tablet Take 5 mg by mouth 2 (two) times daily.   MAGNESIUM PO Take 2 tablets by mouth daily.   Melatonin 10 MG TABS Take 1 tablet by mouth at bedtime as needed (sleep).   rosuvastatin (CRESTOR) 10 MG tablet Take 1 tablet (10 mg total) by mouth daily.   topiramate (TOPAMAX) 50 MG tablet Take 1 tablet (50 mg total) by mouth 2 (two) times daily.   No facility-administered encounter medications on file as of 05/20/2023.  :   Review of Systems:  Out of a complete 14 point review of systems, all are reviewed and negative with the exception of these symptoms as listed below:  Review of Systems  Neurological:        Patient is here alone for hospital follow-up for migraine management. He states he has several questions about his hospitalization. He states he has had migraines for about 5 years. He went to the ER this time because he had a migraine for a week straight. He states he was told he had a vertebral artery dissection. Patient reports he is doing pretty well now. He has had minor headaches but no migraines since the hospitalization. He has  some auras that last 30 seconds. The aura is a zigzag light.    Objective:  Neurological Exam  Physical Exam Physical Examination:   Vitals:   05/20/23 1122  BP: 128/78  Pulse: 95    General Examination: The patient is a very pleasant 32 y.o. male in no acute distress. He appears well-developed and well-nourished and well groomed.   HEENT: Normocephalic, atraumatic, pupils are equal, round and reactive to light, no photophobia, corrective eyeglasses in place.  Extraocular tracking is good without limitation to gaze excursion or nystagmus noted. Hearing is grossly intact. Face is symmetric with normal facial animation. Speech is clear with no dysarthria noted. There is no hypophonia. There is no lip, neck/head, jaw or voice tremor. Neck is supple with full range of passive and active motion. There are no carotid bruits on auscultation.  No difficulty with speech or tongue movements.  No involuntary abnormal movements.  Chest: Clear to auscultation without wheezing, rhonchi or crackles noted.  Heart: S1+S2+0, regular and normal without murmurs, rubs or gallops noted.   Abdomen: Soft, non-tender and non-distended.  Extremities: There is no pitting edema in the distal lower extremities bilaterally.   Skin: Warm and dry without trophic changes noted.   Musculoskeletal: exam reveals no obvious joint deformities.   Neurologically:  Mental status: The patient is awake, alert and oriented in all 4 spheres. His immediate and remote memory, attention, language skills and fund of knowledge are appropriate. There is no evidence of aphasia, agnosia, apraxia or anomia. Speech is clear with normal prosody and enunciation. Thought process is linear. Mood is normal and affect is normal.  Cranial nerves II - XII are as described above under HEENT exam.  Motor exam: Normal bulk, strength and tone is noted.  Fine motor skills and coordination: Intact finger taps, hand movements and rapid alternating  patting in both upper extremities, normal foot movements.  Cerebellar testing: No dysmetria or intention tremor. There is no truncal or gait ataxia.  Normal finger-to-nose, normal foot agility.  No drift, no tremor with posture or action, no resting tremor, no rebound. Romberg negative.  Reflexes 2+ throughout, toes are downgoing bilaterally. Sensory exam: intact to light touch in the upper and lower extremities.  Gait, station and balance: He stands easily. No veering to one side is noted. No leaning to one side is noted. Posture is age-appropriate and stance is narrow based. Gait shows normal stride length and normal pace. No problems turning are noted.  Normal tandem walk.  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  He was hospitalized recently with a vertebral artery dissection and small cerebellar stroke.  He has no residual symptoms from these, continues on Eliquis.  He was started in the hospital on Topamax low-dose, currently at 50 mg twice daily which he tolerates and reports good improvement, essentially no migraine since admission, generally had episodic issues with migraines with aura for the past approximately 5 years. Neurological exam is nonfocal which is reassuring, he had extensive workup in the hospital and we had a lengthy discussion about hospital workup and recommendations.  He had multiple questions which I answered to the best of my abilities.   Here are the discussion points we covered today (he was given instructions in MyChart as below):  I do believe you will benefit from seeing our stroke specialist in follow-up in this clinic.  I have asked for Mason Parks to weigh in as you  have questions about the Eliquis and whether or not you should switch to aspirin or another blood thinner.  I have requested a second opinion from him.  He is better equipped to answer some of your specific questions. For your reference, these were the recommendations from the stroke specialist in the hospital, Mason Parks, when he saw you on 05/09/2023: <<Eliquis (apixaban) daily prior to admission, now on Eliquis (apixaban) daily. Continue eliquis on discharge. Recommend to repeat CTA neck in 2 months to evaluate healing of R VA. Therapy recommendations:  No follow up needed, Disposition:  Home. Hx of pulmonary embolism, Currently on Eliquis, started in January on 2024. Previous hypercoagulable work up done in 09/2022- negative and followed up with hematology. Pt stated that he has appointment with hematology in 08/2023. Presumably will take off eliquis at that time. We recommend ASA 81 after eliquis discontinuation >> Since you have benefit it for your migraines with Topamax low-dose, I recommend that you continue with 50 mg twice daily.  I would be happy to take care of the refills on the Topamax when you need it or you can ask your primary care to maintain you on it. Please remember, common headache triggers are: sleep deprivation, dehydration, overheating, stress, hypoglycemia or skipping meals and blood sugar fluctuations, excessive pain medications or excessive alcohol use or caffeine withdrawal. Some people have food triggers such as aged cheese, orange juice or chocolate, especially dark chocolate, or MSG (monosodium glutamate). Try to avoid these headache triggers as much possible. It may be helpful to keep a headache diary to figure out what makes your headaches worse or brings them on and what alleviates them. Some people report headache onset after exercise but studies have shown that regular exercise may actually prevent headaches from coming. If you have exercise-induced headaches, please make sure that  you drink plenty of fluid before and after exercising and that you  do not over do it and do not overheat. I am glad to hear that you are feeling close to or back to baseline and that your dizziness has subsided.  From the neurological standpoint there are no restrictions for activities, with the exception to avoid heavy weight lifting and dangerous contact sports, rockclimbing, scuba diving and bungee jumping.  You can get your medical clearance to go back to work from your primary care as medical clearance should come from a medical healthcare provider, who will look into all your medical history and make a decision based off of your recent and prior medical history and current progress.   He was also advised to follow-up routinely to see one of our nurse practitioners for migraine checkup in about 6 months.   I answered all his questions today and he was in agreement with our plan. This was an extended visit of over 60 minutes with copious record review involved and addressing multiple problems and extensive counseling and coordination of care. Mason Foley, MD, PhD

## 2023-05-20 NOTE — Patient Instructions (Addendum)
It was nice to meet you today.  I am glad to hear that you are feeling better.  Here is what we discussed today:  I do believe you will benefit from seeing our stroke specialist in follow-up in this clinic.  I have asked for Dr. Pearlean Brownie to weigh in as you have questions about the Eliquis and whether or not you should switch to aspirin or another blood thinner.  I have requested a second opinion from him.  He is better equipped to answer some of your specific questions. For your reference, these were the recommendations from the stroke specialist in the hospital, Dr. Roda Shutters, when he saw you on 05/09/2023: <<Eliquis (apixaban) daily prior to admission, now on Eliquis (apixaban) daily. Continue eliquis on discharge. Recommend to repeat CTA neck in 2 months to evaluate healing of R VA. Therapy recommendations:  No follow up needed, Disposition:  Home. Hx of pulmonary embolism, Currently on Eliquis, started in January on 2024. Previous hypercoagulable work up done in 09/2022- negative and followed up with hematology. Pt stated that he has appointment with hematology in 08/2023. Presumably will take off eliquis at that time. We recommend ASA 81 after eliquis discontinuation >> Since you have benefit it for your migraines with Topamax low-dose, I recommend that you continue with 50 mg twice daily.  I would be happy to take care of the refills on the Topamax when you need it or you can ask your primary care to maintain you on it. Please remember, common headache triggers are: sleep deprivation, dehydration, overheating, stress, hypoglycemia or skipping meals and blood sugar fluctuations, excessive pain medications or excessive alcohol use or caffeine withdrawal. Some people have food triggers such as aged cheese, orange juice or chocolate, especially dark chocolate, or MSG (monosodium glutamate). Try to avoid these headache triggers as much possible. It may be helpful to keep a headache diary to figure out what makes your  headaches worse or brings them on and what alleviates them. Some people report headache onset after exercise but studies have shown that regular exercise may actually prevent headaches from coming. If you have exercise-induced headaches, please make sure that you drink plenty of fluid before and after exercising and that you do not over do it and do not overheat. I am glad to hear that you are feeling close to or back to baseline and that your dizziness has subsided.  From the neurological standpoint there are no restrictions for activities, with the exception to avoid heavy weight lifting and dangerous contact sports, rockclimbing, scuba diving and bungee jumping.  You can get your medical clearance to go back to work from your primary care as medical clearance should come from a medical healthcare provider, who will look into all your medical history and make a decision based off of your recent and prior medical history and current progress.

## 2023-05-27 ENCOUNTER — Encounter (HOSPITAL_COMMUNITY): Payer: Self-pay | Admitting: Emergency Medicine

## 2023-05-27 ENCOUNTER — Emergency Department (HOSPITAL_COMMUNITY): Payer: BC Managed Care – PPO

## 2023-05-27 ENCOUNTER — Emergency Department (HOSPITAL_COMMUNITY)
Admission: EM | Admit: 2023-05-27 | Discharge: 2023-05-28 | Disposition: A | Discharge status: Home / Self Care | Payer: BC Managed Care – PPO | Attending: Emergency Medicine | Admitting: Emergency Medicine

## 2023-05-27 ENCOUNTER — Other Ambulatory Visit: Payer: Self-pay

## 2023-05-27 DIAGNOSIS — R42 Dizziness and giddiness: Secondary | ICD-10-CM | POA: Diagnosis present

## 2023-05-27 DIAGNOSIS — I639 Cerebral infarction, unspecified: Secondary | ICD-10-CM | POA: Diagnosis not present

## 2023-05-27 DIAGNOSIS — I7774 Dissection of vertebral artery: Secondary | ICD-10-CM

## 2023-05-27 DIAGNOSIS — Z7901 Long term (current) use of anticoagulants: Secondary | ICD-10-CM | POA: Insufficient documentation

## 2023-05-27 LAB — CBC WITH DIFFERENTIAL/PLATELET
Abs Immature Granulocytes: 0.05 10*3/uL (ref 0.00–0.07)
Basophils Absolute: 0.1 10*3/uL (ref 0.0–0.1)
Basophils Relative: 0 %
Eosinophils Absolute: 0.1 10*3/uL (ref 0.0–0.5)
Eosinophils Relative: 1 %
HCT: 45.7 % (ref 39.0–52.0)
Hemoglobin: 15.7 g/dL (ref 13.0–17.0)
Immature Granulocytes: 0 %
Lymphocytes Relative: 13 %
Lymphs Abs: 1.5 10*3/uL (ref 0.7–4.0)
MCH: 28.8 pg (ref 26.0–34.0)
MCHC: 34.4 g/dL (ref 30.0–36.0)
MCV: 83.7 fL (ref 80.0–100.0)
Monocytes Absolute: 0.5 10*3/uL (ref 0.1–1.0)
Monocytes Relative: 4 %
Neutro Abs: 9.4 10*3/uL — ABNORMAL HIGH (ref 1.7–7.7)
Neutrophils Relative %: 82 %
Platelets: 378 10*3/uL (ref 150–400)
RBC: 5.46 MIL/uL (ref 4.22–5.81)
RDW: 11.7 % (ref 11.5–15.5)
WBC: 11.5 10*3/uL — ABNORMAL HIGH (ref 4.0–10.5)
nRBC: 0 % (ref 0.0–0.2)

## 2023-05-27 LAB — MAGNESIUM: Magnesium: 2.2 mg/dL (ref 1.7–2.4)

## 2023-05-27 LAB — COMPREHENSIVE METABOLIC PANEL
ALT: 55 U/L — ABNORMAL HIGH (ref 0–44)
AST: 29 U/L (ref 15–41)
Albumin: 4.6 g/dL (ref 3.5–5.0)
Alkaline Phosphatase: 56 U/L (ref 38–126)
Anion gap: 9 (ref 5–15)
BUN: 14 mg/dL (ref 6–20)
CO2: 18 mmol/L — ABNORMAL LOW (ref 22–32)
Calcium: 9.1 mg/dL (ref 8.9–10.3)
Chloride: 109 mmol/L (ref 98–111)
Creatinine, Ser: 1.28 mg/dL — ABNORMAL HIGH (ref 0.61–1.24)
GFR, Estimated: >60 mL/min (ref >60)
Glucose, Bld: 97 mg/dL (ref 70–99)
Potassium: 3.8 mmol/L (ref 3.5–5.1)
Sodium: 136 mmol/L (ref 135–145)
Total Bilirubin: 0.3 mg/dL (ref 0.3–1.2)
Total Protein: 7.4 g/dL (ref 6.5–8.1)

## 2023-05-27 LAB — T4, FREE: Free T4: 0.99 ng/dL (ref 0.61–1.12)

## 2023-05-27 LAB — TSH: TSH: 0.446 u[IU]/mL (ref 0.350–4.500)

## 2023-05-27 MED ORDER — IOHEXOL 350 MG/ML SOLN
75.0000 mL | Freq: Once | INTRAVENOUS | Status: AC | PRN
  Administered 2023-05-27: 75 mL via INTRAVENOUS

## 2023-05-27 MED ORDER — LORAZEPAM 2 MG/ML IJ SOLN
1.0000 mg | Freq: Once | INTRAMUSCULAR | Status: AC | PRN
  Administered 2023-05-27: 1 mg via INTRAVENOUS
  Filled 2023-05-27: qty 1

## 2023-05-27 MED ORDER — SODIUM CHLORIDE 0.9 % IV BOLUS
1000.0000 mL | Freq: Once | INTRAVENOUS | Status: AC
  Administered 2023-05-27: 1000 mL via INTRAVENOUS

## 2023-05-27 NOTE — Consult Note (Signed)
NEUROLOGY CONSULT NOTE   Date of service: May 27, 2023 Patient Name: Mason Parks MRN:  [id number] DOB:  Jun 30, 1991 Chief Complaint: "Dizziness " Requesting Provider: Benjiman Core, MD  History of Present Illness  Tadeo Besecker is a 32 y.o. 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  He reports he was working at his computer when he significant to feel like the world was spinning.  This lasted 1 to 2 minutes.  He felt very unwell during the episode but no other associated neurological symptoms such as weakness, numbness etc.  He also reports that he is not tolerating topiramate well and feels like he is having many side effects including cognitive slowing, paresthesias, as well as initial dizziness when starting the medication.  That dizziness has resolved but the other side effects remain quite troubling and he would like to come off of this medication and consider alternative medications  He notes he has recently had a CTA chest completed yesterday for follow-up of his PE which showed that the clot had resolved with no new concerns on CTA chest.  Regarding this PE notes document that it was possibly provoked in the setting of a long drive but he notes that his girlfriend lives in [place] and he does this drive frequently at least once a month or so.  Regarding his dissection he confirms that he had neck pain while exiting a car but does not have any clear neck extension injury.  Did use massager occasionally with his neck slightly extended but has not done this since his dissection happened  LKW: Approximately 2 PM. Modified rankin score: 0-Completely asymptomatic and back to baseline post- stroke  IV Thrombolysis:  EVT: No, exam not c/w LVO    ROS  Comprehensive ROS performed and pertinent positives documented in HPI   Past History   Past Medical History:  Diagnosis Date   Migraine    Pulmonary emboli (HCC)     History reviewed. No pertinent surgical history.  Family History: Family History  Problem Relation Age of Onset   Hypertension Mother    Migraines Neg Hx     Social History  reports that he has quit smoking. His smoking use included cigarettes. He uses smokeless tobacco. He reports that he does not currently use alcohol. He reports that he does not currently use drugs.  Allergies  Allergen Reactions   Pork-Derived Products Other (See Comments)    Religious beliefs    Medications  No current facility-administered medications for this encounter.  Current Outpatient Medications:    ELIQUIS 5 MG TABS tablet, Take 5 mg by mouth 2 (two) times daily., Disp: , Rfl:    MAGNESIUM PO, Take 2 tablets by mouth daily., Disp: , Rfl:    Melatonin 10 MG TABS, Take 1 tablet by mouth at bedtime as needed (sleep)., Disp: , Rfl:    rosuvastatin (CRESTOR) 10 MG tablet, Take 1 tablet (10 mg total) by mouth daily., Disp: 30 tablet, Rfl: 2   topiramate (TOPAMAX) 50 MG tablet, Take 1 tablet (50 mg total) by mouth 2 (two) times daily., Disp: 60 tablet, Rfl: 2  Vitals   Vitals:   05/27/23 1417 05/27/23 1418 05/27/23 1422 05/27/23 1822  BP:   123/81 125/71  Pulse:   82 77  Resp:  18 17  Temp:   98 F (36.7 C) 97.7 F (36.5 C)  TempSrc:   Oral Oral  SpO2: 98%  99% 99%  Weight:  69 kg    Height:  5\' 9"  (1.753 m)      Body mass index is 22.46 kg/m.  Physical Exam   Constitutional: Appears well-developed and well-nourished.  Psych: Affect appropriate to situation.  Does appear mildly anxious Eyes: No scleral injection.  HENT: No OP obstruction.  Head: Normocephalic. Cardiovascular: Normal rate, sinus arrhythmia on EKG Respiratory: Effort normal, non-labored breathing.  GI: Soft.  No distension. There is no  tenderness. Skin: WDI visible skin  Neurologic Examination   Neuro: Mental Status: Patient is awake, alert, oriented to person, place, month, year, and situation. Patient is able to give a clear and coherent history. No signs of aphasia or neglect He does ask the same questions repeatedly about imaging, location of dissection, location of stroke but otherwise appears to have good recollection of prior events and workup (matching what is documented in notes) Cranial Nerves: II: Visual Fields are full. Pupils are equal, round, and reactive to light.   III,IV, VI: EOMI without ptosis or diploplia.  No nystagmus V: Facial sensation is symmetric to temperature VII: Facial movement is symmetric.  VIII: hearing is intact to voice, symmetric on tuning fork testing X: Uvula elevates symmetrically XI: Shoulder shrug is symmetric. XII: tongue is midline without atrophy or fasciculations.  Motor: Tone is normal. Bulk is normal. 5/5 strength was present in all four extremities.  Mild to moderate enhanced physiologic tremor Sensory: Sensation is symmetric to light touch and temperature in the arms and legs. Deep Tendon Reflexes: 2+ and symmetric in the biceps and patellae.  No Hoffmann's Plantars: Toes are downgoing bilaterally.  Cerebellar: FNF and HKS are intact bilaterally Gait: Able to rise on heels and toes.  Stable tandem gait.  Normal casual gait  NIHSS total 0    Labs   CBC:  Recent Labs  Lab 05/27/23 1453  WBC 11.5*  NEUTROABS 9.4*  HGB 15.7  HCT 45.7  MCV 83.7  PLT 378    Basic Metabolic Panel:  Lab Results  Component Value Date   NA 136 05/27/2023   K 3.8 05/27/2023   CO2 18 (L) 05/27/2023   GLUCOSE 97 05/27/2023   BUN 14 05/27/2023   CREATININE 1.28 (H) 05/27/2023   CALCIUM 9.1 05/27/2023   GFRNONAA >60 05/27/2023   Lipid Panel:  Lab Results  Component Value Date   LDLCALC 97 05/09/2023   HgbA1c:  Lab Results  Component Value Date   HGBA1C 5.3  05/09/2023   Urine Drug Screen:     Component Value Date/Time   LABOPIA NONE DETECTED 05/09/2023 1017   COCAINSCRNUR NONE DETECTED 05/09/2023 1017   LABBENZ NONE DETECTED 05/09/2023 1017   AMPHETMU NONE DETECTED 05/09/2023 1017   THCU POSITIVE (A) 05/09/2023 1017   LABBARB NONE DETECTED 05/09/2023 1017    Alcohol Level     Component Value Date/Time   ETH <10 05/08/2023 1217   INR  Lab Results  Component Value Date   INR 1.1 05/08/2023   APTT  Lab Results  Component Value Date   APTT 29 05/08/2023     CT angio Head and Neck with contrast(Personally reviewed, agree with radiology): 1. Stable appearance of short-segment dissection involving the distal right V2 segment with associated up to moderate stenosis. 2. Otherwise stable and normal CTA of the head and neck. No other large  vessel occlusion or other emergent finding. No other hemodynamically significant or correctable stenosis.    MRI Brain(Personally reviewed, agree with radiology): 1. New 5 mm acute to early subacute ischemic nonhemorrhagic right cerebellar infarct. 2. Additional new punctate acute ischemic subcortical infarct at the left occipital lobe.   Impression   Kamarie Palma is a 32 y.o. male with past medical history significant for hemoptysis and pulmonary infarcts (2020, unclear etiology, query possibility of another PE at that time), possibly provoked versus unprovoked PE (January 2024, on Eliquis), vertebral artery dissection mid September 2024.  Long discussion with patient, data indicate that dual antiplatelet therapy is noninferior to anticoagulation for prevention of stroke in the subacute period after dissection.  He has discussed with his hematologist who would prefer to keep him on anticoagulation for now given his history of PE and had suggested discussion with neurologist about potential for addition of aspirin.  We discussed that addition of aspirin is associated with slightly increased risk of  hemorrhage and there are no data to guide this but given that he is having further events despite adherence to Eliquis I think it is reasonable to add aspirin for 21-day course for now (defer to outpatient neurologist and longer-term aspirin use).  Both new infarcts seen today are in the expected vascular distribution for his vertebral artery dissection and therefore I do not think he needs admission for repeat echocardiogram or other additional workup  Exam is very benign other than some repetitive questions (query anxiety versus side effect of topiramate)  Recommendations   -Add aspirin 81 mg daily -Defer any further/repeat hypercoagulable workup to hematology -At patient's request, can try lamotrigine to replace topiramate as below.  Long-term could consider CGRP inhibitor or other treatments based on response/tolerability to lamotrigine   Lamotrigine  Topirimate    Morning  Night Morning Night  Week 1 and 2   none   25 mg   none  50 mg   Week 3 and 4   25 mg    25 mg  none  50 mg  Week 5   25 mg   50 mg  None   25 mg  Week 6   50 mg   75 mg  none  25 mg  Week 7   75 mg 100 mg  none  STOP  Week 8  100 mg 125 mg  none  none  -Close outpatient follow-up with neurology and hematology   ______________________________________________________________________    Nunzio Cory MD-PhD [facility] [phone]   Discussed with Dr. Rubin Payor via phone and secure chat.  Thank you for this interesting consultation

## 2023-05-27 NOTE — ED Provider Triage Note (Signed)
Emergency Medicine Provider Triage Evaluation Note  Mason Parks , a 32 y.o. male  was evaluated in triage.  Pt complains of acute onset dizziness while at work sitting at his desk, lasted 1-2 minutes, then went away. Coworker called ambulance. Room was spinning around him.   Per chart review, had L PE diagnosed in January and was started on eliquis. He was recently admitted from 05/08/23-05/09/23 for increased frequency of migraines w/ visual aura. 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 Brain MRI showed punctate acute infarct in R cerebellum. Recommended eliquis and f/u imaging in 2 months. Topamax added by neurology for migraines. He had f/u CT scan yesterday which showed resolution of left pulmonary embolus.   Reports topamax has helped his migraines but he doesn't like it, states it makes him feel funny and "off." Has been taking it every day.   Endorses bilateral hand/toes numbness frequently. Denies visual changes. Endorses slight headaches all the time. Also endorses new neck pains on both sides of his neck over the last week. Denies falls/trauma. Endorses a viral syndrome for the last week with reported fever/cough or so but is on the mend from that.   Review of Systems  As listed above  Physical Exam  BP 123/81   Pulse 82   Temp 98 F (36.7 C) (Oral)   Resp 18   Ht 5\' 9"  (1.753 m)   Wt 69 kg   SpO2 99%   BMI 22.46 kg/m  Gen:   Awake, no distress   Resp:  Normal effort  MSK:   Moves extremities without difficulty   Medical Decision Making  Medically screening exam initiated at 2:41 PM.  Appropriate orders placed.  Elfego Giammarino was informed that the remainder of the evaluation will be completed by another provider, this initial triage assessment does not replace that evaluation, and the importance of remaining in the ED until their evaluation is complete.    Loetta Rough, MD 05/27/23 1452

## 2023-05-27 NOTE — ED Notes (Signed)
Pt transported to mri 

## 2023-05-27 NOTE — ED Triage Notes (Signed)
Per [facility] pt coming from home c/o dizziness and near syncopal episode. C/o weakness over past couple days. Recently started on topomax. Diagnosed with blood clot 3 weeks ago and started on eliquis.

## 2023-05-27 NOTE — ED Provider Notes (Signed)
[place] EMERGENCY DEPARTMENT AT [facility] Provider Note   CSN: [id number] Arrival date & time: 05/27/23  1416     History  Chief Complaint  Patient presents with   Dizziness    Mason Parks is a 32 y.o. male.   Dizziness Patient presents after episode of dizziness.  Had some rather severe episode.  As if the room was spinning.  Felt did have recent cerebellar stroke with vertebral artery dissection.  States started Topamax for headaches while was in the hospital and since starting that is had tingling in his hands and feet.  Has seen his neurologist for his headaches.  He is on Eliquis.  Did have previous pulmonary embolisms that started the Eliquis.  Also has had some neck pain.     Past Medical History:  Diagnosis Date   Migraine    Pulmonary emboli (HCC)     Home Medications Prior to Admission medications   Medication Sig Start Date End Date Taking? Authorizing Provider  ELIQUIS 5 MG TABS tablet Take 5 mg by mouth 2 (two) times daily.    [provider]  MAGNESIUM PO Take 2 tablets by mouth daily.    [provider]  Melatonin 10 MG TABS Take 1 tablet by mouth at bedtime as needed (sleep).    [provider]  rosuvastatin (CRESTOR) 10 MG tablet Take 1 tablet (10 mg total) by mouth daily. 05/09/23   Kathlen Mody, MD  topiramate (TOPAMAX) 50 MG tablet Take 1 tablet (50 mg total) by mouth 2 (two) times daily. 05/09/23   Kathlen Mody, MD      Allergies    Pork-derived products    Review of Systems   Review of Systems  Neurological:  Positive for dizziness.    Physical Exam Updated Vital Signs BP 125/71 (BP Location: Right Arm)   Pulse 77   Temp 97.7 F (36.5 C) (Oral)   Resp 17   Ht 5\' 9"  (1.753 m)   Wt 69 kg   SpO2 99%   BMI 22.46 kg/m  Physical Exam Vitals reviewed.  Eyes:     Comments: Patient is wearing sunglasses  Cardiovascular:     Rate and Rhythm: Regular rhythm.  Pulmonary:     Breath sounds: No  wheezing or rhonchi.  Abdominal:     Tenderness: There is no abdominal tenderness.  Musculoskeletal:        General: No tenderness.     Cervical back: Neck supple.  Skin:    Capillary Refill: Capillary refill takes less than 2 seconds.  Neurological:     Mental Status: He is alert and oriented to person, place, and time.     Comments: Awake and appropriate.  Answers questions.  Can move all extremities freely.     ED Results / Procedures / Treatments   Labs (all labs ordered are listed, but only abnormal results are displayed) Labs Reviewed  CBC WITH DIFFERENTIAL/PLATELET - Abnormal; Notable for the following components:      Result Value   WBC 11.5 (*)    Neutro Abs 9.4 (*)    All other components within normal limits  COMPREHENSIVE METABOLIC PANEL - Abnormal; Notable for the following components:   CO2 18 (*)    Creatinine, Ser 1.28 (*)    ALT 55 (*)    All other components within normal limits  MAGNESIUM  TSH  T4, FREE    EKG EKG Interpretation Date/Time:  Tuesday May 27 2023 14:29:10 EDT Ventricular Rate:  88 PR Interval:  150 QRS Duration:  94 QT Interval:  340 QTC Calculation: 411 R Axis:   79  Text Interpretation: Sinus rhythm with marked sinus arrhythmia Nonspecific ST and T wave abnormality No significant change from prior Confirmed by Vivi Barrack [phone]) on 05/27/2023 2:41:12 PM  Radiology MR BRAIN WO CONTRAST  Result Date: 05/27/2023 CLINICAL DATA:  Initial evaluation for neuro deficit, stroke suspected. EXAM: MRI HEAD WITHOUT CONTRAST TECHNIQUE: Multiplanar, multiecho pulse sequences of the brain and surrounding structures were obtained without intravenous contrast. COMPARISON:  Prior study from 05/08/2023. FINDINGS: Brain: Examination degraded by motion. Cerebral volume within normal limits. No significant cerebral white matter disease. Previously identified punctate right cerebellar infarct is no longer clearly seen. There is a new 5 mm focus of mild  diffusion signal abnormality within the right cerebellar hemisphere, consistent with a small acute to early subacute ischemic infarct (series 2, image 14). Additional punctate acute ischemic subcortical infarct present at the left occipital lobe (series 2, image 23). No associated hemorrhage or mass effect. No mass lesion, midline shift or mass effect. No hydrocephalus or extra-axial fluid collection. Pituitary gland suprasellar region within normal limits. Vascular: Major intracranial vascular flow voids are maintained. Skull and upper cervical spine: Cerebellar tonsils are somewhat low-lying without overt Chiari malformation. Bone marrow signal intensity normal. No scalp soft tissue abnormality. Sinuses/Orbits: Globes and orbital soft tissues within normal limits. Paranasal sinuses are clear. No mastoid effusion. Other: None. IMPRESSION: 1. New 5 mm acute to early subacute ischemic nonhemorrhagic right cerebellar infarct. 2. Additional new punctate acute ischemic subcortical infarct at the left occipital lobe. Electronically Signed   By: Rise Mu M.D.   On: 05/27/2023 20:57   DG Chest 2 View  Result Date: 05/27/2023 CLINICAL DATA:  Dizziness and cough.  Near syncopal episode. EXAM: CHEST - 2 VIEW COMPARISON:  09/29/2018 FINDINGS: Heart size is normal. The lungs are clear. No infiltrate, collapse or effusion. Normal vascularity. No abnormal bone finding. IMPRESSION: No active cardiopulmonary disease. Electronically Signed   By: Paulina Fusi M.D.   On: 05/27/2023 16:46    Procedures Procedures    Medications Ordered in ED Medications  sodium chloride 0.9 % bolus 1,000 mL (0 mLs Intravenous Stopped 05/27/23 2202)  LORazepam (ATIVAN) injection 1 mg (1 mg Intravenous Given 05/27/23 1950)  iohexol (OMNIPAQUE) 350 MG/ML injection 75 mL (75 mLs Intravenous Contrast Given 05/27/23 2046)    ED Course/ Medical Decision Making/ A&P                                 Medical Decision Making Amount  and/or Complexity of Data Reviewed Radiology: ordered.  Risk Prescription drug management.  Patient with recent cerebellar stroke.  Known vertebral dissection.  Did have another episode of dizziness.  Still feeling slightly dizzy but not like it was before.  Has felt bad for a few days.  Also tingling in his hands and feet that is I think likely related to his medicine.  Not a TNK candidate due to anticoagulation and known vertebral dissection.  Discussed with Dr. Iver Nestle from neurology about imaging choice.  Got MRI which did show new acute to subacute stroke.  Also stable vertebral dissection on CTA.  Has been seen by neurology.  Pending recommendations.          Final Clinical Impression(s) / ED Diagnoses Final diagnoses:  Cerebrovascular accident (CVA), unspecified mechanism (HCC)  Vertebral artery dissection (  [facility])    Rx / DC Orders ED Discharge Orders     None         Benjiman Core, MD 05/27/23 2309

## 2023-05-27 NOTE — ED Notes (Signed)
Neurology at bedside.

## 2023-05-28 ENCOUNTER — Telehealth: Payer: Self-pay | Admitting: *Deleted

## 2023-05-28 MED ORDER — LAMOTRIGINE 25 MG PO TABS: ORAL_TABLET | ORAL | 0 refills | Status: DC

## 2023-05-28 NOTE — Discharge Instructions (Addendum)
I have written a prescription for the first month of lamictal. Take the topamax 50mg  AT NIGHT (none in the morning) with this addition. Follow up with neurology for further instructions on dosing on both.  Neurology also recommends that you add low dose aspirin (81mg ) daily.

## 2023-05-28 NOTE — Telephone Encounter (Signed)
Pt called regarding pharmacy not receiving Rx as stated on After Visit Summary (AVS).  RNCM called in [facility] in [place] to confirm receipt of transmittal.  Pharmacy tech confirmed that pt has picked up Rx.

## 2023-05-29 ENCOUNTER — Encounter: Payer: Self-pay | Admitting: Neurology

## 2023-06-05 ENCOUNTER — Ambulatory Visit (INDEPENDENT_AMBULATORY_CARE_PROVIDER_SITE_OTHER): Payer: BC Managed Care – PPO | Admitting: Neurology

## 2023-06-05 ENCOUNTER — Encounter: Payer: Self-pay | Admitting: Neurology

## 2023-06-05 VITALS — BP 114/72 | HR 73 | Ht 69.0 in | Wt 150.0 lb

## 2023-06-05 DIAGNOSIS — I2782 Chronic pulmonary embolism: Secondary | ICD-10-CM | POA: Diagnosis not present

## 2023-06-05 DIAGNOSIS — I7774 Dissection of vertebral artery: Secondary | ICD-10-CM

## 2023-06-05 DIAGNOSIS — I639 Cerebral infarction, unspecified: Secondary | ICD-10-CM | POA: Diagnosis not present

## 2023-06-05 DIAGNOSIS — Z8669 Personal history of other diseases of the nervous system and sense organs: Secondary | ICD-10-CM

## 2023-06-05 NOTE — Patient Instructions (Signed)
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  I recommend he continue Eliquis and aspirin for 3 months and then discontinue aspirin.  We discussed risks need for diagnostic cerebral catheter angiogram to look for interval improvement and if not found may consider elective angioplasty stenting.  Patient prefers a noninvasive approach for now and will schedule diagnostic CT angiogram for follow-up prior to next visit in 3 months.  If however he has recurrent neurovascular symptoms may need to do that sooner.  I have also encouraged him to maintain a healthy lifestyle and not to smoke cigarettes, use alcohol, marijuana or any nicotine products.  Check TCD bubble study to look for PFO.  His migraine seems to have resolved hence recommend tapering Topamax to 25 mg daily for a week and discontinue.  Continue follow-up with his hematologist for his history of pulmonary embolism and need for long-term anticoagulation on Eliquis.  Return for follow-up in the future in 3 months or call earlier if necessary.  Vertebral Artery Dissection  Vertebral artery dissection is a tear in a vertebral artery. The vertebral arteries are major blood vessels at the base of the neck. They carry blood from the heart to the brain. When an artery tears, blood collects inside the layers of the artery wall. This can cause a blood clot. This condition increases the risk for stroke if it is not diagnosed and treated right away. It is a common cause of stroke in people who are 39-44 years old. What are the causes? This condition may be caused by: A neck injury due to sudden or too much neck movement. This is called a traumatic dissection. Having weak blood vessel walls. The walls may tear even when no injury occurs (spontaneous dissection). Chiropractic adjustments. A minimal form of blunt  trauma. In many cases, the cause of this condition is not known. What increases the risk? The following factors may make you more likely to develop this condition: High blood pressure (hypertension). Migraines. Inherited diseases that affect the strength or shape of the blood vessels. Tobacco use. What are the signs or symptoms? Symptoms usually appear within days of an injury, but sometimes they may not appear for weeks or years. Common symptoms of this condition include: Headache, with a sharp, stabbing pain in the head, neck, eye, or face. Dizziness. Vertigo. This is a feeling that you or things around you are moving when they are not. Double vision. Other symptoms include: Hoarse voice. Hearing loss. Hiccups. Loss of taste. Difficulty speaking. Loss of balance and coordination. Difficulty swallowing. Ear pain. Nausea and vomiting. Loss of feeling in the torso, legs, or arms. How is this diagnosed? This condition is diagnosed based on tests, such as: CT angiogram. This test uses a computer to take X-rays of your vertebral arteries. A dye may be injected into your blood to show the inside of your blood vessels more clearly. MRI angiogram. This is used to check the health of the blood vessels. Cerebral angiogram. This test takes X-ray images of the blood vessels in the neck and brain. A dye is used to show the inside of the blood vessels. Doppler ultrasound. This test uses sound waves to create images of the vertebral arteries. It shows how well blood flows through your arteries. How is this treated? Treatment depends on the cause of your vertebral artery dissection and on your overall health. The  goal of treatment is to prevent a stroke. If you are having a stroke, it is important to get treatment quickly. Treatment may include: Blood thinners. This medicine helps to prevent blood clots. This may be given first through an IV, and then as pills for 3-6 months. Antiplatelet  medicines. This medicine keeps the platelets from sticking together, which prevents blood clots from forming. Procedures to widen a narrow blood vessel (angioplasty) or to place a mesh tube (stent) inside the blood vessel to keep it open. Surgery to repair the area. This is rarely needed. Follow these instructions at home: Medicines Take over-the-counter and prescription medicines only as told by your health care provider. If you are taking blood thinners: Talk with your health care provider before you take any medicines that contain aspirin or NSAIDs, such as ibuprofen. These medicines increase your risk for dangerous bleeding. Take your medicine exactly as told, at the same time every day. Avoid activities that could cause injury or bruising. Follow instructions about how to prevent falls. Wear a medical alert bracelet or carry a card that lists what medicines you take. Lifestyle  Work with your health care provider to control hypertension. This may include: Exercising regularly. Check with your health care provider before starting a new type of exercise. Eating a heart-healthy diet of fruits, vegetables, whole grains, and lean meats. Limit unhealthy fats. Eat more healthy fats such as avocados, eggs, and oily fish. Reducing the amount of salt (sodium) that you eat to less than 1,500 mg a day. Reducing stress by doing things that you enjoy and avoiding things that cause you stress. Avoid activities that put you at risk for neck injuries, such as contact sports. Do not use any products that contain nicotine or tobacco. These products include cigarettes, chewing tobacco, and vaping devices, such as e-cigarettes. If you need help quitting, ask your health care provider. General instructions Do not drink alcohol if: Your health care provider tells you not to drink. You are pregnant, may be pregnant, or are planning to become pregnant. If you drink alcohol: Limit how much you have to: 0-1  drink a day for women. 0-2 drinks a day for men. Know how much alcohol is in your drink. In the [place] one drink equals one 12 oz bottle of beer (355 mL), one 5 oz glass of wine (148 mL), or one 1 oz glass of hard liquor (44 mL). Keep all follow-up visits. This is important. Get help right away if: You have any symptoms of a stroke. "BE FAST" is an easy way to remember the main warning signs of a stroke: B - Balance. Signs are dizziness, sudden trouble walking, or loss of balance. E - Eyes. Signs are trouble seeing or a sudden change in vision. F - Face. Signs are sudden weakness or numbness of the face, or the face or eyelid drooping on one side. A - Arms. Signs are weakness or numbness in an arm. This happens suddenly and usually on one side of the body. S - Speech. Signs are sudden trouble speaking, slurred speech, or trouble understanding what people say. T - Time. Time to call emergency services. Write down what time symptoms started. You have other signs of a stroke, such as: A sudden, severe headache with no known cause. Nausea or vomiting. Seizure. You have other symptoms, such as: Difficulty breathing. Chest pain. These symptoms may be an emergency. Get help right away. Call 911. Do not wait to see if the symptoms will go  away. Do not drive yourself to the hospital. Summary Vertebral artery dissection is a tear in an artery that carries blood from the heart to the brain. Symptoms usually appear within days of an injury, but sometimes they may not appear for weeks or years. This condition increases the risk for stroke if it is not diagnosed and treated right away. Treatment depends on the cause of your condition and on your overall health. The goal of treatment is to prevent a stroke. Get help right away if you have any symptoms of a stroke. This information is not intended to replace advice given to you by your health care provider. Make sure you discuss any questions you have  with your health care provider. Document Revised: 06/19/2021 Document Reviewed: 06/19/2021 Elsevier Patient Education  2024 [employer].
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
## 2023-06-23 ENCOUNTER — Telehealth: Payer: Self-pay | Admitting: Neurology

## 2023-06-23 MED ORDER — LAMOTRIGINE 25 MG PO TABS: 25.0000 mg | ORAL_TABLET | Freq: Two times a day (BID) | ORAL | 0 refills | Status: DC

## 2023-06-23 NOTE — Telephone Encounter (Signed)
Sent a refill for the lamotrigine 25 mg twice a day for 1 month. Will need to likely discuss continuing to increase the medication and will address this upon our return to work on Wednesday but went ahead and sent a refill for now.

## 2023-06-23 NOTE — Telephone Encounter (Signed)
Pt called and LVM needing a refill for his  lamoTRIgine (LAMICTAL) 25 MG tablet to be sent in to the [facility] on [address] [place]

## 2023-06-30 ENCOUNTER — Inpatient Hospital Stay: Payer: BC Managed Care – PPO | Admitting: Diagnostic Neuroimaging

## 2023-07-01 ENCOUNTER — Other Ambulatory Visit: Payer: Self-pay | Admitting: Neurology

## 2023-07-01 MED ORDER — LAMOTRIGINE 25 MG PO TABS: 25.0000 mg | ORAL_TABLET | Freq: Two times a day (BID) | ORAL | 0 refills | Status: DC

## 2023-07-01 NOTE — Addendum Note (Signed)
Addended by: Judi Cong on: 07/01/2023 04:12 PM   Modules accepted: Orders

## 2023-07-01 NOTE — Telephone Encounter (Signed)
Called the patient to confirm. He is still taking the lamotrigine 25 mg twice a day and his migraines are well controlled and he is tolerating the medication. The topiramate has been discontinued.I will send a updated script for a 3 month supply per Dr Pearlean Brownie since the pt is tolerating the medication well

## 2023-07-01 NOTE — Telephone Encounter (Signed)
Pt also inquired about the CTA ordered. He states he is suppose to have that scheduled in December. I do see where this is ordered and was ordered in October. The patient wanted to make sure that someone will call so that he can get this scheduled in December before the year ends. Advised I would pass this along.

## 2023-07-02 NOTE — Telephone Encounter (Signed)
Mason Parks: [id number] exp. 07/02/23-07/31/23 sent to GI [phone]

## 2023-07-21 ENCOUNTER — Other Ambulatory Visit: Payer: Self-pay | Admitting: Neurology

## 2023-07-22 ENCOUNTER — Ambulatory Visit (HOSPITAL_COMMUNITY)
Admission: RE | Admit: 2023-07-22 | Discharge: 2023-07-22 | Disposition: A | Discharge status: Home / Self Care | Payer: BC Managed Care – PPO | Source: Ambulatory Visit | Attending: Neurology | Admitting: Neurology

## 2023-07-22 DIAGNOSIS — I7774 Dissection of vertebral artery: Secondary | ICD-10-CM | POA: Insufficient documentation

## 2023-07-22 DIAGNOSIS — Z8669 Personal history of other diseases of the nervous system and sense organs: Secondary | ICD-10-CM | POA: Diagnosis not present

## 2023-07-24 ENCOUNTER — Ambulatory Visit
Admission: RE | Admit: 2023-07-24 | Discharge: 2023-07-24 | Disposition: A | Discharge status: Home / Self Care | Payer: BC Managed Care – PPO | Source: Ambulatory Visit | Attending: Neurology | Admitting: Neurology

## 2023-07-24 DIAGNOSIS — Z8669 Personal history of other diseases of the nervous system and sense organs: Secondary | ICD-10-CM

## 2023-07-24 DIAGNOSIS — I7774 Dissection of vertebral artery: Secondary | ICD-10-CM

## 2023-07-24 MED ORDER — IOPAMIDOL (ISOVUE-370) INJECTION 76%
100.0000 mL | Freq: Once | INTRAVENOUS | Status: AC | PRN
  Administered 2023-07-24: 65 mL via INTRAVENOUS

## 2023-07-31 NOTE — Progress Notes (Signed)
These results were communicated to the patient at the time of the study.

## 2023-08-01 ENCOUNTER — Telehealth: Payer: Self-pay | Admitting: *Deleted

## 2023-08-01 NOTE — Telephone Encounter (Signed)
noted 

## 2023-08-01 NOTE — Telephone Encounter (Signed)
-----   Message from Delia Heady sent at 07/31/2023  6:50 AM EST ----- These results were communicated to the patient at the time of the study.

## 2023-08-05 ENCOUNTER — Other Ambulatory Visit: Payer: Self-pay | Admitting: Neurology

## 2023-08-05 ENCOUNTER — Telehealth: Payer: Self-pay | Admitting: Neurology

## 2023-08-05 MED ORDER — ROSUVASTATIN CALCIUM 10 MG PO TABS: 10.0000 mg | ORAL_TABLET | Freq: Every day | ORAL | 2 refills | Status: AC

## 2023-08-05 MED ORDER — ROSUVASTATIN CALCIUM 10 MG PO TABS: 10.0000 mg | ORAL_TABLET | Freq: Every day | ORAL | 2 refills | Status: DC

## 2023-08-05 NOTE — Telephone Encounter (Signed)
Pt states he hasn't heard anything regarding his CT scan and would like to speak with someone about the results. Also states rosuvastatin (CRESTOR) 10 MG tablet refill wasn't approved per pharmacy and he would like to know why.

## 2023-08-05 NOTE — Telephone Encounter (Signed)
Called the patient back. I don't see where we ever received the refill request for the cholesterol medication. Advised that I will forward a refill for him for a couple of months and then going forward should discuss future refills with PCP. In regard to the CTA results from 12/5, informed the patient that this still has not posted. Informed the patient I will let Dr Pearlean Brownie know and see if maybe he is able to review and result on his end, otherwise the impression should be reviewed here soon. Pt verbalized understanding.

## 2023-08-08 ENCOUNTER — Telehealth: Payer: Self-pay | Admitting: Neurology

## 2023-08-08 NOTE — Telephone Encounter (Signed)
Jane with [facility] states they have results of a CTA head that they need to relay to clinical asap. States they need someone to call even if we already have the results on our end. Requesting call back to [phone]

## 2023-08-10 NOTE — Progress Notes (Signed)
Kindly inform the patient that CT angiogram study of the blood vessels in the brain is suboptimal as the study does not go down and captured the level of the previously seen dissection.  No blockages in the blood vessels visualized in the brain or any worrisome finding.  As long as is not having any new stroke or TIA symptoms we can wait and repeat the study in 6 months

## 2023-08-11 NOTE — Telephone Encounter (Signed)
Dr Pearlean Brownie did review the results and we will notify the patient of the results.

## 2023-08-12 ENCOUNTER — Telehealth: Payer: Self-pay

## 2023-08-12 NOTE — Telephone Encounter (Signed)
I spoke with the patient and provided him the results of the CT angiogram study. He would like to know why the study did not go down and capture the level of the previously seen dissection. He feels like if the scan was not done correctly it should be repeated at the expense of the party in error. He does not want to wait to repeat the scan in 6 months. He is requesting new imaging as soon as possible.  He would like to know if any abnormality could be seen from the previous dissection at the levels imaged.  He is requesting a call back or mychart message from Dr. Pearlean Brownie as soon as possible.

## 2023-08-12 NOTE — Telephone Encounter (Signed)
-----   Message from Delia Heady sent at 08/10/2023  3:26 PM EST ----- Kindly inform the patient that CT angiogram study of the blood vessels in the brain is suboptimal as the study does not go down and captured the level of the previously seen dissection.  No blockages in the blood vessels visualized in the brain or any worrisome finding.  As long as is not having any new stroke or TIA symptoms we can wait and repeat the study in 6 months

## 2023-08-19 ENCOUNTER — Other Ambulatory Visit: Payer: Self-pay | Admitting: Neurology

## 2023-08-19 DIAGNOSIS — I7774 Dissection of vertebral artery: Secondary | ICD-10-CM

## 2023-08-25 ENCOUNTER — Institutional Professional Consult (permissible substitution): Payer: BC Managed Care – PPO | Admitting: Neurology

## 2023-08-26 ENCOUNTER — Telehealth: Payer: Self-pay | Admitting: Neurology

## 2023-08-26 NOTE — Telephone Encounter (Signed)
 CTA head [employer] auth: [id number] exp. 08/26/23-09/24/23  CTA neck [employer] auth: [id number] exp. 08/26/23-09/24/23 sent to GI [phone]

## 2023-10-18 ENCOUNTER — Other Ambulatory Visit: Payer: Self-pay | Admitting: Neurology

## 2023-10-20 NOTE — Telephone Encounter (Signed)
 Last seen on 06/05/23 Follow up scheduled on 01/14/24

## 2023-10-31 ENCOUNTER — Other Ambulatory Visit: Payer: Self-pay | Admitting: Neurology

## 2023-11-03 ENCOUNTER — Other Ambulatory Visit: Payer: Self-pay | Admitting: Neurology

## 2023-11-03 NOTE — Telephone Encounter (Signed)
 Last seen on 06/05/23 Follow up scheduled on 01/14/24

## 2023-12-16 ENCOUNTER — Other Ambulatory Visit: Payer: Self-pay | Admitting: Neurology

## 2023-12-16 DIAGNOSIS — I7774 Dissection of vertebral artery: Secondary | ICD-10-CM

## 2023-12-23 ENCOUNTER — Ambulatory Visit (INDEPENDENT_AMBULATORY_CARE_PROVIDER_SITE_OTHER): Payer: Self-pay

## 2023-12-23 DIAGNOSIS — I7774 Dissection of vertebral artery: Secondary | ICD-10-CM

## 2023-12-23 MED ORDER — GADOBENATE DIMEGLUMINE 529 MG/ML IV SOLN
14.0000 mL | Freq: Once | INTRAVENOUS | Status: AC | PRN
  Administered 2023-12-23: 14 mL via INTRAVENOUS

## 2024-01-03 ENCOUNTER — Ambulatory Visit: Payer: Self-pay | Admitting: Neurology

## 2024-01-03 NOTE — Progress Notes (Signed)
 Kindly inform the patient that MR angiogram study of the blood vessels in the neck show healing of the previous area of narrowing of the right vertebral artery which is a good thing.  Nothing to worry about

## 2024-01-09 ENCOUNTER — Telehealth: Payer: Self-pay | Admitting: Neurology

## 2024-01-09 NOTE — Telephone Encounter (Signed)
 rs appointment

## 2024-01-14 ENCOUNTER — Ambulatory Visit: Payer: BC Managed Care – PPO | Admitting: Neurology

## 2024-11-01 ENCOUNTER — Ambulatory Visit: Admitting: Neurology
